# Patient Record
Sex: Male | Born: 1960 | Race: Black or African American | Hispanic: Yes | Smoking: Current every day smoker
Health system: Southern US, Community
[De-identification: ages and names within clinical notes are randomized; demographics above are authoritative.]

## PROBLEM LIST (undated history)

## (undated) DIAGNOSIS — I10 Essential (primary) hypertension: Secondary | ICD-10-CM

## (undated) DIAGNOSIS — K922 Gastrointestinal hemorrhage, unspecified: Secondary | ICD-10-CM

## (undated) DIAGNOSIS — Z8711 Personal history of peptic ulcer disease: Secondary | ICD-10-CM

## (undated) DIAGNOSIS — K920 Hematemesis: Secondary | ICD-10-CM

## (undated) DIAGNOSIS — F1010 Alcohol abuse, uncomplicated: Secondary | ICD-10-CM

## (undated) DIAGNOSIS — E119 Type 2 diabetes mellitus without complications: Secondary | ICD-10-CM

## (undated) DIAGNOSIS — F1410 Cocaine abuse, uncomplicated: Secondary | ICD-10-CM

## (undated) HISTORY — PX: ABDOMINAL SURGERY: SHX537

## (undated) MED ORDER — HYDROCODONE-ACETAMINOPHEN 5 MG-325 MG TAB
5-325 mg | ORAL_TABLET | ORAL | Status: DC | PRN
Start: ? — End: 2017-10-26

## (undated) MED ORDER — CLINDAMYCIN 300 MG CAP
300 mg | ORAL_CAPSULE | Freq: Four times a day (QID) | ORAL | Status: AC
Start: ? — End: 2012-08-07

---

## 2012-07-28 LAB — METABOLIC PANEL, BASIC
Anion gap: 10 mmol/L (ref 3.0–18)
BUN/Creatinine ratio: 10 — ABNORMAL LOW (ref 12–20)
BUN: 16 MG/DL (ref 7.0–18)
CO2: 26 MMOL/L (ref 21–32)
Calcium: 9.1 MG/DL (ref 8.5–10.1)
Chloride: 105 MMOL/L (ref 100–108)
Creatinine: 1.63 MG/DL — ABNORMAL HIGH (ref 0.6–1.3)
GFR est AA: 58 mL/min/{1.73_m2} — ABNORMAL LOW (ref >60)
GFR est non-AA: 48 mL/min/{1.73_m2} — ABNORMAL LOW (ref >60)
Glucose: 111 MG/DL — ABNORMAL HIGH (ref 74–99)
Potassium: 4.8 MMOL/L (ref 3.5–5.5)
Sodium: 141 MMOL/L (ref 136–145)

## 2012-07-28 LAB — CBC WITH AUTOMATED DIFF
ABS. BASOPHILS: 0 10*3/uL (ref 0.0–0.06)
ABS. EOSINOPHILS: 0.3 10*3/uL (ref 0.0–0.4)
ABS. LYMPHOCYTES: 1.4 10*3/uL (ref 0.9–3.6)
ABS. MONOCYTES: 1.1 10*3/uL (ref 0.05–1.2)
ABS. NEUTROPHILS: 7.1 10*3/uL (ref 1.8–8.0)
BASOPHILS: 0 % (ref 0–2)
EOSINOPHILS: 3 % (ref 0–5)
HCT: 37.5 % (ref 36.0–48.0)
HGB: 12.1 g/dL — ABNORMAL LOW (ref 13.0–16.0)
LYMPHOCYTES: 14 % — ABNORMAL LOW (ref 21–52)
MCH: 25.1 PG (ref 24.0–34.0)
MCHC: 32.3 g/dL (ref 31.0–37.0)
MCV: 77.6 FL (ref 74.0–97.0)
MONOCYTES: 11 % — ABNORMAL HIGH (ref 3–10)
MPV: 11.2 FL (ref 9.2–11.8)
NEUTROPHILS: 72 % (ref 40–73)
PLATELET: 197 10*3/uL (ref 135–420)
RBC: 4.83 M/uL (ref 4.70–5.50)
RDW: 14.9 % — ABNORMAL HIGH (ref 11.6–14.5)
WBC: 9.9 10*3/uL (ref 4.6–13.2)

## 2012-07-28 MED ADMIN — ketorolac (TORADOL) injection 30 mg: INTRAVENOUS | @ 20:00:00 | NDC 00409379501

## 2012-07-28 MED ADMIN — trimethoprim-sulfamethoxazole (BACTRIM DS, SEPTRA DS) 160-800 mg per tablet: ORAL | @ 20:00:00 | NDC 68084023011

## 2012-07-28 MED ADMIN — ceFAZolin (ANCEF) 1 gram injection: INTRAVENOUS | @ 20:00:00 | NDC 00409080501

## 2012-07-28 MED FILL — KETOROLAC TROMETHAMINE 60 MG/2 ML IM: 60 mg/2 mL | INTRAMUSCULAR | Qty: 2

## 2012-07-28 MED FILL — SODIUM CHLORIDE 0.9 % IV PIGGY BACK: INTRAVENOUS | Qty: 100

## 2012-07-28 MED FILL — TRIMETHOPRIM-SULFAMETHOXAZOLE 160 MG-800 MG TAB: 160-800 mg | ORAL | Qty: 2

## 2012-07-28 MED FILL — CEFAZOLIN 1 GRAM SOLUTION FOR INJECTION: 1 gram | INTRAMUSCULAR | Qty: 1000

## 2012-07-30 NOTE — ED Provider Notes (Addendum)
SEEN BY P.A. FOR CELLULITIS OF THE LEFT KNEE.   CONNECT CARE SYSTEM DOWN AND THINGS ON PAPER

## 2012-07-31 MED ADMIN — HYDROmorphone (PF) (DILAUDID) injection 1 mg: INTRAMUSCULAR | @ 21:00:00 | NDC 00409128331

## 2012-07-31 MED ADMIN — clindamycin (CLEOCIN) capsule 300 mg: ORAL | @ 21:00:00 | NDC 68084024311

## 2012-07-31 MED FILL — CLINDAMYCIN 150 MG CAP: 150 mg | ORAL | Qty: 2

## 2012-07-31 MED FILL — HYDROMORPHONE (PF) 1 MG/ML IJ SOLN: 1 mg/mL | INTRAMUSCULAR | Qty: 1

## 2012-07-31 NOTE — ED Notes (Signed)
I have reviewed discharge instructions with the patient.  The patient verbalized understanding.  Patient armband removed and shredded

## 2012-07-31 NOTE — ED Provider Notes (Signed)
HPI Comments: 51 year old male returns for a recheck. He reports that the swelling and warmth to his left left knee is much better, but the pain is increasing. He denies fever, chills.     Patient is a 51 y.o. male presenting with wound check. The history is provided by the patient. No language interpreter was used.   Wound Check          Past Medical History   Diagnosis Date   ??? Hypertension         History reviewed. No pertinent past surgical history.      History reviewed. No pertinent family history.     History     Social History   ??? Marital Status: DIVORCED     Spouse Name: N/A     Number of Children: N/A   ??? Years of Education: N/A     Occupational History   ??? Not on file.     Social History Main Topics   ??? Smoking status: Not on file   ??? Smokeless tobacco: Not on file   ??? Alcohol Use: Not on file   ??? Drug Use: Not on file   ??? Sexually Active: Not on file     Other Topics Concern   ??? Not on file     Social History Narrative   ??? No narrative on file                  ALLERGIES: Review of patient's allergies indicates no known allergies.      Review of Systems   Constitutional: Negative.  Negative for fever and chills.   HENT: Negative.    Respiratory: Negative.    Cardiovascular: Negative.    Gastrointestinal: Negative.  Negative for nausea and vomiting.   Musculoskeletal:        See HPI   Neurological: Negative.    All other systems reviewed and are negative.        Filed Vitals:    07/31/12 1546   BP: 144/81   Pulse: 83   Temp: 97.6 ??F (36.4 ??C)   Resp: 17   Height: 5\' 7"  (1.702 m)   Weight: 80.287 kg (177 lb)   SpO2: 100%            Physical Exam   Nursing note and vitals reviewed.  Constitutional: He is oriented to person, place, and time. He appears well-developed and well-nourished. No distress.   HENT:   Head: Normocephalic and atraumatic.   Eyes: Pupils are equal, round, and reactive to light.   Neck: Normal range of motion. Neck supple.   Cardiovascular: Normal rate.    Pulmonary/Chest: Effort normal.    Musculoskeletal: He exhibits edema and tenderness.        Left knee: He exhibits swelling and erythema. He exhibits normal range of motion, no effusion, no ecchymosis and no deformity.   Full range of motion with pain   Neurological: He is alert and oriented to person, place, and time.   Skin: Skin is warm and dry. He is not diaphoretic.        Psychiatric: He has a normal mood and affect. His behavior is normal.        MDM     Differential Diagnosis; Clinical Impression; Plan:     Abscess to left knee, will drain the area. Dr Denny Levy evaluated patient as he saw the knee two days ago. Agrees with plan, will change Keflex to Clindamycin.  I&D Abcess Simple  Consent: Verbal consent obtained.  Consent given by: patient  Date/Time: 07/31/2012 5:28 PM  Performed by: NPSupervising provider: Dr Denny Levy  Preparation: skin prepped with Betadine  Pre-procedure re-eval: Immediately prior to the procedure, the patient was reevaluated and found suitable for the planned procedure and any planned medications.  Time out: Immediately prior to the procedure a time out was called to verify the correct patient, procedure, equipment, staff and marking as appropriate..  Location: left knee.  Anesthesia: local infiltration  Local anesthetic: lidocaine 1% without epinephrine  Anesthetic total: 4 ml  Scalpel size: 11  Incision type: single with marsupialization  Complexity: simple  Drainage: purulent and bloody  Drainage amount: moderate  Wound treatment: wound left open  Post-procedure: dressing applied  Patient tolerance: Patient tolerated the procedure well with no immediate complications.  My total time at bedside, performing this procedure was 1-15 minutes.        Diagnosis:   1. Abscess    2. Cellulitis          Disposition: Home    Follow-up Information    Follow up With Details Comments Contact Info    [facility] EMERGENCY DEPT  As needed if symptoms worsen [phone]    Hyman Bible In 3 days  [address]  [place]  [address]  [phone]            Current Discharge Medication List      START taking these medications    Details   clindamycin (CLEOCIN) 300 mg capsule Take 1 Cap by mouth four (4) times daily for 7 days.  Qty: 28 Cap, Refills: 0      HYDROcodone-acetaminophen (NORCO) 5-325 mg per tablet Take 1 Tab by mouth every four (4) hours as needed for Pain.  Qty: 20 Tab, Refills: 0         CONTINUE these medications which have NOT CHANGED    Details   trimethoprim-sulfamethoxazole (BACTRIM) 80-400 mg per tablet Take 1 Tab by mouth two (2) times a day.      cephALEXin (KEFLEX) 250 mg capsule Take 500 mg by mouth four (4) times daily.      oxyCODONE-acetaminophen (PERCOCET) 10-325 mg per tablet Take 1 Tab by mouth.      hydrochlorothiazide (HYDRODIURIL) 25 mg tablet Take 25 mg by mouth daily.

## 2012-07-31 NOTE — ED Notes (Signed)
Seen 2 days ago for left knee infection. Here for recheck

## 2012-07-31 NOTE — ED Provider Notes (Signed)
I was personally available for consultation in the emergency department.  I have not seen the patient but have reviewed the chart prior to discharge and agree with the documentation recorded by the [facility], including the assessment, treatment plan, and disposition.  Amalia Greenhouse, MD  Pt seen with Cyndia Bent

## 2012-08-03 LAB — CULTURE, WOUND W GRAM STAIN

## 2014-05-02 ENCOUNTER — Emergency Department: Payer: Self-pay | Admitting: Emergency Medicine

## 2014-12-16 ENCOUNTER — Ambulatory Visit
Admit: 2014-12-16 | Discharge: 2014-12-16 | Payer: PRIVATE HEALTH INSURANCE | Attending: Surgery | Primary: Family Medicine

## 2014-12-16 DIAGNOSIS — R1031 Right lower quadrant pain: Secondary | ICD-10-CM

## 2014-12-16 NOTE — Progress Notes (Signed)
Hernia Evaluation      Subjective:     Justin Pace is a 54 y.o. male with a history of 4 right inguinal hernia repairs in the [place] from 2010 to 2015. 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 He is complaining about renewed right inguinal pain and numbness. He denies a bulge but says the pain is unbearable and is now associated with weakness in his anterior thigh. He says this has caused him to fall and recently break his right wrist. He is referred here by the [facility] for hernia surgery or ablation of a nerve as needed.     Patient Active Problem List    Diagnosis Date Noted   ??? Abscess 07/31/2012   ??? Cellulitis 07/31/2012     Past Medical History   Diagnosis Date   ??? Hypertension    ??? Neuropathic pain    ??? Diabetes (HCC)    ??? Depression    ??? Anxiety    ??? Gastric ulcer       Past Surgical History   Procedure Laterality Date   ??? Hx hernia repair Right 2010-2015     inguinal X5    ??? Nerve block        History   Substance Use Topics   ??? Smoking status: Current Some Day Smoker   ??? Smokeless tobacco: Not on file   ??? Alcohol Use: No      Family History   Problem Relation Age of Onset   ??? Alzheimer Mother    ??? Dementia Mother    ??? Diabetes Father    ??? Diabetes Brother       Current Outpatient Prescriptions   Medication Sig   ??? zolpidem (AMBIEN) 5 mg tablet Take  by mouth nightly as needed for Sleep.   ??? gabapentin (NEURONTIN) 100 mg capsule Take  by mouth three (3) times daily.   ??? metFORMIN (GLUCOPHAGE) 500 mg tablet Take  by mouth two (2) times daily (with meals).   ??? ASPIRIN/SALICYLAMIDE/CAFFEINE (BC HEADACHE POWDER PO) Take  by mouth.   ??? cephALEXin (KEFLEX) 250 mg capsule Take 500 mg by mouth four (4) times daily.   ??? oxyCODONE-acetaminophen (PERCOCET) 10-325 mg per tablet Take 1 Tab by mouth.   ??? HYDROcodone-acetaminophen (NORCO) 5-325 mg per tablet Take 1 Tab by mouth every four (4) hours as needed for Pain.     No current facility-administered medications for this visit.      Allergies    Allergen Reactions   ??? Motrin [Ibuprofen] Swelling        Review of Systems:  Positive in BOLD    CONST: Fever, weight loss, fatigue or chills  GI: Nausea, vomiting, abdominal pain, change in bowel habits, hematochezia, melena, and GERD   INTEG: Dermatitis, abnormal moles  HEENT: Recent changes in vision, vertigo, epistaxis, dysphagia and hoarseness  CV: Chest pain, palpitations, HTN, edema and varicosities  RESP: Cough, shortness of breath, wheezing, hemoptysis, snoring and reactive airway disease  GU: Hematuria, dysuria, frequency, urgency, nocturia and stress urinary incontinence   MS: Weakness, joint pain and arthritis  ENDO: Diabetes, thyroid disease, polyuria, polydipsia, polyphagia, poor wound healing, heat intolerance, cold intolerance  LYMPH/HEME: Anemia, bruising and history of blood transfusions  NEURO: Dizziness, headache, fainting, seizures and stroke, neuropathy  PSYCH: Anxiety and depression    Objective:     BP 133/83 mmHg   Pulse 80   Temp(Src) 98.6 ??F (37 ??C) (Oral)   Resp 20  Ht 5\' 7"  (1.702 m)   Wt 85.73 kg (189 lb)   BMI 29.59 kg/m2    Physical Exam:      GENERAL: alert, cooperative, mild distress, appears stated age  ZOX:[id number]:conjunctivae and sclerae normal, pupils equal, round, reactive to light, extraocular movements intact without nystagmus  THROAT & NECK: no erythema or exudates noted and neck supple and symmetrical; no palpable masses  LUNG: clear to auscultation bilaterally  HEART: Regular rate and rhythm  ABDOMEN: 2 topical pain patches overlying the right inguinal region. No evidence of hernia at the right groin. Complains of numbness about 5 cm superior to the inguinal incision and pain in this region as well. He states he is also having pain and weakness of the anterior thigh. I find no femoral hernia either.   EXTREMITIES:  Tender of the anterior thigh to light palpation  SKIN: Normal.      Assessment:   1. No evidence of recurrent hernia   2. His pain is best described as neuropathic, likely realted to prior nerve ablation attempts.         Plan:     1. [facility] for [facility] to pain management. I do not perform ablations  2.  No role for hernia repair as there is no evidence of hernia at this time.     Signed By: Maudry MayhewGREGORY F Tristy Udovich, MD     December 16, 2014

## 2014-12-16 NOTE — Progress Notes (Signed)
Justin Pace is a 54 y.o. male who presents today with   Chief Complaint   Patient presents with   ??? Inguinal Hernia     Pt presents today c/o of Right inguinal pain and weakness in right leg. Pt has had 5 right inguinal hernia repairs in each year from 2010-2015. Pt is here for surgical intervention.                  1. Have you been to the ER, urgent care clinic since your last visit?  Hospitalized since your last visit?No    2. Have you seen or consulted any other health care providers outside of the [facility] since your last visit?  Include any pap smears or colon screening. No

## 2015-06-12 ENCOUNTER — Telehealth: Payer: Self-pay | Admitting: Family Medicine

## 2017-07-06 ENCOUNTER — Emergency Department: Admit: 2017-07-06 | Primary: Family Medicine

## 2017-07-06 ENCOUNTER — Inpatient Hospital Stay
Admit: 2017-07-06 | Discharge: 2017-07-06 | Disposition: A | Discharge status: Home / Self Care | Attending: Emergency Medicine

## 2017-07-06 DIAGNOSIS — S161XXA Strain of muscle, fascia and tendon at neck level, initial encounter: Secondary | ICD-10-CM

## 2017-07-06 MED ORDER — CYCLOBENZAPRINE 10 MG TAB
10 mg | ORAL | Status: AC
Start: 2017-07-06 — End: 2017-07-06
  Administered 2017-07-06: 19:00:00 via ORAL

## 2017-07-06 MED ORDER — ACETAMINOPHEN 325 MG TABLET
325 mg | ORAL_TABLET | ORAL | 0 refills | Status: DC | PRN
Start: 2017-07-06 — End: 2017-10-26

## 2017-07-06 MED ORDER — HYDROCODONE-ACETAMINOPHEN 5 MG-325 MG TAB
5-325 mg | ORAL | Status: AC
Start: 2017-07-06 — End: 2017-07-06
  Administered 2017-07-06: 19:00:00 via ORAL

## 2017-07-06 MED ORDER — CYCLOBENZAPRINE 10 MG TAB
10 mg | ORAL_TABLET | Freq: Three times a day (TID) | ORAL | 0 refills | Status: DC | PRN
Start: 2017-07-06 — End: 2017-10-26

## 2017-07-06 MED FILL — HYDROCODONE-ACETAMINOPHEN 5 MG-325 MG TAB: 5-325 mg | ORAL | Qty: 1

## 2017-07-06 MED FILL — CYCLOBENZAPRINE 10 MG TAB: 10 mg | ORAL | Qty: 1
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
## 2017-07-06 NOTE — ED Notes (Signed)
Patient discharged to home with Rx and instructions given, patient voices understanding
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
## 2017-10-26 ENCOUNTER — Emergency Department: Admit: 2017-10-26 | Primary: Family Medicine

## 2017-10-26 ENCOUNTER — Inpatient Hospital Stay
Admit: 2017-10-26 | Discharge: 2017-10-27 | Disposition: A | Discharge status: Home / Self Care | Attending: Emergency Medicine

## 2017-10-26 DIAGNOSIS — M25531 Pain in right wrist: Secondary | ICD-10-CM

## 2017-10-26 MED ORDER — MORPHINE 4 MG/ML INTRAVENOUS SOLUTION
4 mg/mL | INTRAVENOUS | Status: AC
Start: 2017-10-26 — End: 2017-10-26
  Administered 2017-10-27: via INTRAVENOUS

## 2017-10-26 MED ORDER — PANTOPRAZOLE 40 MG IV SOLR
40 mg | INTRAVENOUS | Status: AC
Start: 2017-10-26 — End: 2017-10-26
  Administered 2017-10-27: via INTRAVENOUS

## 2017-10-26 MED FILL — PROTONIX 40 MG INTRAVENOUS SOLUTION: 40 mg | INTRAVENOUS | Qty: 40

## 2017-10-26 NOTE — ED Provider Notes (Addendum)
EMERGENCY DEPARTMENT HISTORY AND PHYSICAL EXAM    6:39 PM      Date: 10/26/2017  Patient Name: Justin Pace    History of Presenting Illness     Chief Complaint   Patient presents with   ??? Epigastric Pain   ??? Wrist Pain         History Provided By: Patient    Chief Complaint: epigastric pain, right wrist pain  Duration:  Days  Timing:  Acute, Gradual and Worsening  Location: epigastric, left wrist   Quality: Aching  Severity: Moderate  Modifying Factors: n/a  Associated Symptoms: denies any other associated signs or symptoms      Additional History (Context): Justin Pace is a 57 y.o. male with No significant past medical history who presents with epigastric pain x 3 days similar to previous when patient was diagnosed with stomach ulcer. Denies NVD. States pain is dull, decreases appetite as eating seems to worsen pain. Denies fever, back pain, cp, sob, urinary sx. States he saw GI last Monday and has apt for endoscopy in Feb.    Pt also reports right wrist, slipped while trying to get out of bed the other day. Pain with ROM since. H/o surgery to right wrist so wants to make sure it is not broken.     PCP: Christen Butter, MD    Current Facility-Administered Medications   Medication Dose Route Frequency Provider Last Rate Last Dose   ??? pantoprazole (PROTONIX) injection 40 mg  40 mg IntraVENous NOW Dartha Lodge E, PA-C       ??? morphine injection 4 mg  4 mg IntraVENous NOW Hurman Horn, PA-C         Current Outpatient Medications   Medication Sig Dispense Refill   ??? omeprazole (PRILOSEC) 40 mg capsule Take 1 Cap by mouth daily for 30 days. 30 Cap 0   ??? raNITIdine (ZANTAC) 150 mg tablet Take 1 Tab by mouth two (2) times a day for 14 days. 28 Tab 0   ??? hydroCHLOROthiazide (HYDRODIURIL) 25 mg tablet Take 25 mg by mouth daily.     ??? metFORMIN (GLUCOPHAGE) 500 mg tablet Take  by mouth two (2) times daily (with meals).         Past History     Past Medical History:  Past Medical History:   Diagnosis Date    ??? Anxiety    ??? Depression    ??? Diabetes ([place])    ??? Gastric ulcer    ??? Hypertension    ??? Neuropathic pain        Past Surgical History:  Past Surgical History:   Procedure Laterality Date   ??? HX HERNIA REPAIR Right 2010-2015    inguinal X5    ??? NERVE BLOCK         Family History:  Family History   Problem Relation Age of Onset   ??? Alzheimer Mother    ??? Dementia Mother    ??? Diabetes Father    ??? Diabetes Brother        Social History:  Social History     Tobacco Use   ??? Smoking status: Current Some Day Smoker   ??? Smokeless tobacco: Never Used   Substance Use Topics   ??? Alcohol use: No     Alcohol/week: 0.0 oz   ??? Drug use: No       Allergies:  Allergies   Allergen Reactions   ??? Motrin [Ibuprofen] Swelling   ??? Norco [  Hydrocodone-Acetaminophen] Angioedema         Review of Systems       Review of Systems   Constitutional: Negative for chills and fever.   HENT: Negative for congestion.    Respiratory: Negative for cough and wheezing.    Cardiovascular: Negative for chest pain and leg swelling.   Gastrointestinal: Positive for abdominal pain. Negative for constipation, diarrhea, nausea and vomiting.   Genitourinary: Negative.    Musculoskeletal: Positive for arthralgias and joint swelling.   Skin: Negative.    Neurological: Negative for dizziness, seizures, weakness and headaches.   Hematological: Negative.    Psychiatric/Behavioral: Negative.    All other systems reviewed and are negative.        Physical Exam     Visit Vitals  BP 133/89 (BP 1 Location: Left arm, BP Patient Position: At rest)   Pulse 99   Temp 98.5 ??F (36.9 ??C)   Resp 18   Ht '5\' 7"'$  (1.702 m)   Wt 83.9 kg (185 lb)   SpO2 97%   BMI 28.98 kg/m??         Physical Exam   Constitutional: He is oriented to person, place, and time. He appears well-developed and well-nourished. No distress.   NAD, well hydrated, non toxic     HENT:   Head: Normocephalic and atraumatic.   Nose: Nose normal.   Mouth/Throat: Oropharynx is clear and moist. No oropharyngeal exudate.    Eyes: Conjunctivae and EOM are normal. Pupils are equal, round, and reactive to light.   Neck: Normal range of motion. Neck supple.   Cardiovascular: Normal rate, regular rhythm and normal heart sounds.   No murmur heard.  Pulmonary/Chest: Effort normal and breath sounds normal. No respiratory distress. He has no wheezes. He has no rales.   Abdominal: Soft. He exhibits no distension. There is no hepatosplenomegaly, splenomegaly or hepatomegaly. There is tenderness in the epigastric area. There is no rigidity, no rebound, no guarding, no CVA tenderness, no tenderness at McBurney's point and negative Murphy's sign. No hernia.   Musculoskeletal: Normal range of motion.   Lymphadenopathy:     He has no cervical adenopathy.   Neurological: He is alert and oriented to person, place, and time. No cranial nerve deficit. Coordination normal.   Skin: Skin is warm. No rash noted. He is not diaphoretic.   Psychiatric: He has a normal mood and affect. His behavior is normal.   Nursing note and vitals reviewed.        Diagnostic Study Results     Labs -  Recent Results (from the past 12 hour(s))   CBC WITH AUTOMATED DIFF    Collection Time: 10/26/17  6:45 PM   Result Value Ref Range    WBC 7.0 4.6 - 13.2 K/uL    RBC 5.24 4.70 - 5.50 M/uL    HGB 13.1 13.0 - 16.0 g/dL    HCT 40.5 36.0 - 48.0 %    MCV 77.3 74.0 - 97.0 FL    MCH 25.0 24.0 - 34.0 PG    MCHC 32.3 31.0 - 37.0 g/dL    RDW 18.7 (H) 11.6 - 14.5 %    PLATELET 194 135 - 420 K/uL    MPV 10.6 9.2 - 11.8 FL    NEUTROPHILS 84 (H) 40 - 73 %    LYMPHOCYTES 7 (L) 21 - 52 %    MONOCYTES 8 3 - 10 %    EOSINOPHILS 1 0 - 5 %  BASOPHILS 0 0 - 2 %    ABS. NEUTROPHILS 5.8 1.8 - 8.0 K/UL    ABS. LYMPHOCYTES 0.5 (L) 0.9 - 3.6 K/UL    ABS. MONOCYTES 0.6 0.05 - 1.2 K/UL    ABS. EOSINOPHILS 0.1 0.0 - 0.4 K/UL    ABS. BASOPHILS 0.0 0.0 - 0.1 K/UL    DF AUTOMATED     METABOLIC PANEL, COMPREHENSIVE    Collection Time: 10/26/17  6:45 PM   Result Value Ref Range    Sodium 139 136 - 145 mmol/L     Potassium 3.9 3.5 - 5.5 mmol/L    Chloride 104 100 - 108 mmol/L    CO2 21 21 - 32 mmol/L    Anion gap 14 3.0 - 18 mmol/L    Glucose 135 (H) 74 - 99 mg/dL    BUN 12 7.0 - 18 MG/DL    Creatinine 1.18 0.6 - 1.3 MG/DL    BUN/Creatinine ratio 10 (L) 12 - 20      GFR est AA >60 >60 ml/min/1.3m    GFR est non-AA >60 >60 ml/min/1.792m   Calcium 8.8 8.5 - 10.1 MG/DL    Bilirubin, total 0.4 0.2 - 1.0 MG/DL    ALT (SGPT) 21 16 - 61 U/L    AST (SGOT) 18 15 - 37 U/L    Alk. phosphatase 113 45 - 117 U/L    Protein, total 7.2 6.4 - 8.2 g/dL    Albumin 3.8 3.4 - 5.0 g/dL    Globulin 3.4 2.0 - 4.0 g/dL    A-G Ratio 1.1 0.8 - 1.7     LIPASE    Collection Time: 10/26/17  6:45 PM   Result Value Ref Range    Lipase 125 73 - 393 U/L       Radiologic Studies -   XR WRIST RT AP/LAT/OBL MIN 3V    (Results Pending)   CT ABD PELV W CONT    (Results Pending)         Medical Decision Making   I am the first provider for this patient.    I reviewed the vital signs, available nursing notes, past medical history, past surgical history, family history and social history.    Vital Signs-Reviewed the patient's vital signs.      Records Reviewed: Nursing Notes, Old Medical Records, Previous Radiology Studies and Previous Laboratory Studies (Time of Review: 6:39 PM)    ED Course: Progress Notes, Reevaluation, and Consults:  7:28 PM  Pain improved with medication. Labs reviewed and WNL.   Xray of wrist WNL- I do not appreciate new fracture, will place in ace wrap for comfort and refer to ortho.  CT of abdomen/pelvis- negative for acute process, no new pathology.   S/s most likely c/w gastritis, possible recurrent ulcer. Has GI for f/u and already has endoscopy scheduled for next week.   No indication for further work up/need for further imaging/labs. Will d/chome with strict return sx and f/u    Provider Notes (Medical Decision Making): MDM  Number of Diagnoses or Management Options  Abdominal pain, epigastric:   Wrist pain, acute, right:    Diagnosis management comments: 57yo here with epigastric pain x3 days. Also here with right wrist pain. H/o stomach ulcers feels the same, saw GI Monday and she made apt for endoscopy in Feb. Labs, IV meds, xray.            Amount and/or Complexity of Data Reviewed  Clinical lab tests: ordered  and reviewed  Tests in the radiology section of CPT??: reviewed and ordered           Diagnosis     Clinical Impression: epigastric pain, right wrist pain  Disposition: d/c    Follow-up Information     Follow up With Specialties Details Why Contact Info    Christen Butter, MD [facility]   [place] [address]  [phone]      [facility] EMERGENCY DE[facility] Emergency Medicine   [place] [address]  [phone]    [facility]   [address] [place]  [phone]              Medication List      START taking these medications    omeprazole 40 mg capsule  Commonly known as:  PRILOSEC  Take 1 Cap by mouth daily for 30 days.     raNITIdine 150 mg tablet  Commonly known as:  ZANTAC  Take 1 Tab by mouth two (2) times a day for 14 days.        ASK your doctor about these medications    hydroCHLOROthiazide 25 mg tablet  Commonly known as:  HYDRODIURIL     metFORMIN 500 mg tablet  Commonly known as:  GLUCOPHAGE           Where to Get Your Medications      Information about where to get these medications is not yet available    Ask your nurse or doctor about these medications  ?? omeprazole 40 mg capsule  ?? raNITIdine 150 mg tablet       _______________________________

## 2017-10-26 NOTE — ED Notes (Signed)
I have reviewed discharge instructions with the patient.  The patient verbalized understanding.  Patient discharged without removing armband.  Pt declined removal of armband; pt was warned of dangers associated with loss of patient information.  He verbalized understanding.

## 2017-10-26 NOTE — ED Notes (Signed)
7:25 PM :Pt care assumed from Danielle RankinJacson, Sara E, [place], ED provider. Pt complaint(s), current treatment plan, progression and available diagnostic results have been discussed thoroughly.  Rounding occurred: yes  Intended Disposition: pending CT  Pending diagnostic reports and/or labs (please list): CT    8:51 PM  Pt states they are feeling better. Plan for discharge home with GI FU in 2 days.     Scribe Attestation     Ameren CorporationKayleigh Hopping acting as a Neurosurgeonscribe for and in the presence of Sherren KernsBrown, Paris Hohn B, MD      October 26, 2017 at 8:26 PM       Provider Attestation:      I personally performed the services described in the documentation, reviewed the documentation, as recorded by the scribe in my presence, and it accurately and completely records my words and actions. October 26, 2017 at 8:26 PM - Sherren KernsBrown, Whyatt Klinger B, MD

## 2017-10-26 NOTE — ED Triage Notes (Signed)
Patient states epigastric pain x 3 days.  States last bowel movement today and considered normal.  Patient states injury to right wrist.

## 2017-10-26 NOTE — ED Notes (Signed)
Pt was given ice water to drink; okay per Dr Manson PasseyBrown.  Awaiting results from Dr Manson PasseyBrown.  Pt reports improvement of epigastric pain 5/10 @ present.  Will continue to monitor.

## 2017-10-27 LAB — CBC WITH AUTOMATED DIFF
ABS. BASOPHILS: 0 10*3/uL (ref 0.0–0.1)
ABS. EOSINOPHILS: 0.1 10*3/uL (ref 0.0–0.4)
ABS. LYMPHOCYTES: 0.5 10*3/uL — ABNORMAL LOW (ref 0.9–3.6)
ABS. MONOCYTES: 0.6 10*3/uL (ref 0.05–1.2)
ABS. NEUTROPHILS: 5.8 10*3/uL (ref 1.8–8.0)
BASOPHILS: 0 % (ref 0–2)
EOSINOPHILS: 1 % (ref 0–5)
HCT: 40.5 % (ref 36.0–48.0)
HGB: 13.1 g/dL (ref 13.0–16.0)
LYMPHOCYTES: 7 % — ABNORMAL LOW (ref 21–52)
MCH: 25 PG (ref 24.0–34.0)
MCHC: 32.3 g/dL (ref 31.0–37.0)
MCV: 77.3 FL (ref 74.0–97.0)
MONOCYTES: 8 % (ref 3–10)
MPV: 10.6 FL (ref 9.2–11.8)
NEUTROPHILS: 84 % — ABNORMAL HIGH (ref 40–73)
PLATELET: 194 10*3/uL (ref 135–420)
RBC: 5.24 M/uL (ref 4.70–5.50)
RDW: 18.7 % — ABNORMAL HIGH (ref 11.6–14.5)
WBC: 7 10*3/uL (ref 4.6–13.2)

## 2017-10-27 LAB — METABOLIC PANEL, COMPREHENSIVE
A-G Ratio: 1.1 (ref 0.8–1.7)
ALT (SGPT): 21 U/L (ref 16–61)
AST (SGOT): 18 U/L (ref 15–37)
Albumin: 3.8 g/dL (ref 3.4–5.0)
Alk. phosphatase: 113 U/L (ref 45–117)
Anion gap: 14 mmol/L (ref 3.0–18)
BUN/Creatinine ratio: 10 — ABNORMAL LOW (ref 12–20)
BUN: 12 MG/DL (ref 7.0–18)
Bilirubin, total: 0.4 MG/DL (ref 0.2–1.0)
CO2: 21 mmol/L (ref 21–32)
Calcium: 8.8 MG/DL (ref 8.5–10.1)
Chloride: 104 mmol/L (ref 100–108)
Creatinine: 1.18 MG/DL (ref 0.6–1.3)
GFR est AA: >60 mL/min/{1.73_m2} (ref >60)
GFR est non-AA: >60 mL/min/{1.73_m2} (ref >60)
Globulin: 3.4 g/dL (ref 2.0–4.0)
Glucose: 135 mg/dL — ABNORMAL HIGH (ref 74–99)
Potassium: 3.9 mmol/L (ref 3.5–5.5)
Protein, total: 7.2 g/dL (ref 6.4–8.2)
Sodium: 139 mmol/L (ref 136–145)

## 2017-10-27 LAB — TROPONIN I: Troponin-I, QT: <0.02 NG/ML (ref 0.00–0.06)

## 2017-10-27 LAB — LIPASE: Lipase: 125 U/L (ref 73–393)

## 2017-10-27 MED ORDER — OMEPRAZOLE 40 MG CAP, DELAYED RELEASE
40 mg | ORAL_CAPSULE | Freq: Every day | ORAL | 0 refills | Status: AC
Start: 2017-10-27 — End: 2017-11-25

## 2017-10-27 MED ORDER — ACETAMINOPHEN-CODEINE 300 MG-30 MG TAB
300-30 mg | ORAL_TABLET | Freq: Four times a day (QID) | ORAL | 0 refills | Status: AC | PRN
Start: 2017-10-27 — End: ?

## 2017-10-27 MED ORDER — LIDOCAINE 5 % (700 MG/PATCH) ADHESIVE PATCH
5 % | CUTANEOUS | 0 refills | Status: AC
Start: 2017-10-27 — End: ?

## 2017-10-27 MED ORDER — IOPAMIDOL 61 % IV SOLN
300 mg iodine /mL (61 %) | Freq: Once | INTRAVENOUS | Status: AC
Start: 2017-10-27 — End: 2017-10-26
  Administered 2017-10-27: via INTRAVENOUS

## 2017-10-27 MED ORDER — DICYCLOMINE 20 MG TAB
20 mg | ORAL_TABLET | Freq: Four times a day (QID) | ORAL | 0 refills | Status: AC
Start: 2017-10-27 — End: 2017-10-31

## 2017-10-27 MED ORDER — RANITIDINE 150 MG TAB
150 mg | ORAL_TABLET | Freq: Two times a day (BID) | ORAL | 0 refills | Status: AC
Start: 2017-10-27 — End: 2017-11-09

## 2017-10-27 MED ORDER — OMEPRAZOLE 40 MG CAP, DELAYED RELEASE
40 mg | ORAL_CAPSULE | Freq: Every day | ORAL | 0 refills | Status: DC
Start: 2017-10-27 — End: 2017-10-26

## 2017-10-27 MED FILL — MORPHINE 4 MG/ML INTRAVENOUS SOLUTION: 4 mg/mL | INTRAVENOUS | Qty: 1

## 2017-10-27 MED FILL — ISOVUE-300  61 % INTRAVENOUS SOLUTION: 300 mg iodine /mL (61 %) | INTRAVENOUS | Qty: 100

## 2018-03-02 ENCOUNTER — Ambulatory Visit: Payer: Self-pay | Admitting: Family Medicine

## 2018-05-06 ENCOUNTER — Ambulatory Visit: Payer: Self-pay | Admitting: Family Medicine

## 2018-08-21 ENCOUNTER — Other Ambulatory Visit: Payer: Self-pay

## 2018-08-21 ENCOUNTER — Encounter: Payer: Self-pay | Admitting: *Deleted

## 2018-08-21 ENCOUNTER — Emergency Department
Admission: EM | Admit: 2018-08-21 | Discharge: 2018-08-22 | Discharge status: Against Medical Advice | Payer: Self-pay | Attending: Emergency Medicine | Admitting: Emergency Medicine

## 2018-08-21 DIAGNOSIS — R7989 Other specified abnormal findings of blood chemistry: Secondary | ICD-10-CM

## 2018-08-21 DIAGNOSIS — F17210 Nicotine dependence, cigarettes, uncomplicated: Secondary | ICD-10-CM | POA: Insufficient documentation

## 2018-08-21 DIAGNOSIS — F1410 Cocaine abuse, uncomplicated: Secondary | ICD-10-CM

## 2018-08-21 DIAGNOSIS — I10 Essential (primary) hypertension: Secondary | ICD-10-CM

## 2018-08-21 DIAGNOSIS — F1010 Alcohol abuse, uncomplicated: Secondary | ICD-10-CM

## 2018-08-21 DIAGNOSIS — K264 Chronic or unspecified duodenal ulcer with hemorrhage: Secondary | ICD-10-CM

## 2018-08-21 DIAGNOSIS — Z7984 Long term (current) use of oral hypoglycemic drugs: Secondary | ICD-10-CM | POA: Insufficient documentation

## 2018-08-21 DIAGNOSIS — E876 Hypokalemia: Secondary | ICD-10-CM

## 2018-08-21 DIAGNOSIS — E119 Type 2 diabetes mellitus without complications: Secondary | ICD-10-CM | POA: Insufficient documentation

## 2018-08-21 DIAGNOSIS — Z79899 Other long term (current) drug therapy: Secondary | ICD-10-CM | POA: Insufficient documentation

## 2018-08-21 DIAGNOSIS — Z5329 Procedure and treatment not carried out because of patient's decision for other reasons: Secondary | ICD-10-CM | POA: Insufficient documentation

## 2018-08-21 HISTORY — DX: Cocaine abuse, uncomplicated: F14.10

## 2018-08-21 HISTORY — DX: Essential (primary) hypertension: I10

## 2018-08-21 HISTORY — DX: Type 2 diabetes mellitus without complications: E11.9

## 2018-08-21 HISTORY — DX: Hematemesis: K92.0

## 2018-08-21 HISTORY — DX: Gastrointestinal hemorrhage, unspecified: K92.2

## 2018-08-21 HISTORY — DX: Personal history of peptic ulcer disease: Z87.11

## 2018-08-21 HISTORY — DX: Alcohol abuse, uncomplicated: F10.10

## 2018-08-21 LAB — CBC
HEMATOCRIT: 45.1 % (ref 39.0–52.0)
HEMOGLOBIN: 14.8 g/dL (ref 13.0–17.0)
MCH: 28.7 pg (ref 26.0–34.0)
MCHC: 32.8 g/dL (ref 30.0–36.0)
MCV: 87.4 fL (ref 80.0–100.0)
Platelets: 265 10*3/uL (ref 150–400)
RBC: 5.16 MIL/uL (ref 4.22–5.81)
RDW: 14.4 % (ref 11.5–15.5)
WBC: 4.2 10*3/uL (ref 4.0–10.5)
nRBC: 0 % (ref 0.0–0.2)

## 2018-08-21 LAB — URINE DRUG SCREEN, QUALITATIVE (ARMC ONLY)
Amphetamines, Ur Screen: NOT DETECTED
Barbiturates, Ur Screen: NOT DETECTED
Benzodiazepine, Ur Scrn: NOT DETECTED
CANNABINOID 50 NG, UR ~~LOC~~: NOT DETECTED
COCAINE METABOLITE, UR ~~LOC~~: POSITIVE — AB
MDMA (ECSTASY) UR SCREEN: NOT DETECTED
Methadone Scn, Ur: NOT DETECTED
Opiate, Ur Screen: NOT DETECTED
Phencyclidine (PCP) Ur S: NOT DETECTED
TRICYCLIC, UR SCREEN: NOT DETECTED

## 2018-08-21 LAB — ETHANOL: Alcohol, Ethyl (B): 237 mg/dL — ABNORMAL HIGH (ref <10)

## 2018-08-21 MED ORDER — ONDANSETRON HCL 4 MG/2ML IJ SOLN
4.0000 mg | INTRAMUSCULAR | Status: AC
  Administered 2018-08-21: 4 mg via INTRAVENOUS
  Filled 2018-08-21: qty 2

## 2018-08-21 MED ORDER — MORPHINE SULFATE (PF) 4 MG/ML IV SOLN
4.0000 mg | Freq: Once | INTRAVENOUS | Status: AC
  Administered 2018-08-21: 4 mg via INTRAVENOUS
  Filled 2018-08-21: qty 1

## 2018-08-21 MED ORDER — SODIUM CHLORIDE 0.9 % IV BOLUS
1000.0000 mL | Freq: Once | INTRAVENOUS | Status: AC
  Administered 2018-08-22: 1000 mL via INTRAVENOUS

## 2018-08-21 NOTE — ED Triage Notes (Signed)
Pt presents w/ c/o LUQ abdominal pain. Pt states he has hx of bleeding ulcers and has been having GI bleeding and vomiting blood x 4 days. Pt states he has been drinking ETOH to help with the pain. Pt admits to 2 "bulls" tonight, 8 oz each. Pt is thrashing about on the stretcher and holding his LUQ. Pt suddenly stopped and when asked to rate his pain, had to be prompted x 2 to do so.

## 2018-08-21 NOTE — ED Provider Notes (Addendum)
[facility] Emergency Department Provider Note  ____________________________________________   First MD Initiated Contact with Patient 08/21/18 2302     (approximate)  I have reviewed the triage vital signs and the nursing notes.   HISTORY  Chief Complaint Abdominal Pain    HPI Francisco Coleman is a 57 y.o. male with medical history as listed below and who reports that he gets most of his care at the [facility].  He presents by EMS for evaluation of abdominal pain and the left upper quadrant that has been gradually worsening over the last 4 days.  He reports that he has been vomiting with blood in it and is also been having bright red blood per rectum as well as some dark stools.  He has a history of bleeding ulcers and says that he has been drinking more alcohol than usual to try to help with the pain.  He denies any illegal drug use.  He denies taking any NSAIDs recently and knows he is not supposed to do so.  He is unclear about how much she drinks a day but reports "at least 2".  He denies fever/chills, chest pain, and dysuria.  He reports his pain is severe and says that it comes in waves and feels sharp and stabbing as well as a cramping sensation.  He has not had any recent injury.  Nothing in particular makes it better or worse.   Past Medical History:  Diagnosis Date  . Alcohol abuse   . Cocaine abuse (HCC)   . Diabetes mellitus without complication (HCC)   . GI bleeding   . History of bleeding ulcers   . Hypertension   . Vomiting blood     There are no active problems to display for this patient.   Past Surgical History:  Procedure Laterality Date  . ABDOMINAL SURGERY      Prior to Admission medications   Medication Sig Start Date End Date Taking? Authorizing Provider  hydrochlorothiazide (HYDRODIURIL) 25 MG tablet Take 25 mg by mouth daily.   Yes [provider]  metFORMIN (GLUCOPHAGE) 500 MG tablet Take 500 mg by mouth 2 (two)  times daily with a meal.   Yes [provider]  ondansetron (ZOFRAN ODT) 4 MG disintegrating tablet Allow 1-2 tablets to dissolve in your mouth every 8 hours as needed for nausea/vomiting 08/22/18   Loleta Rose, MD  potassium chloride SA (KLOR-CON M20) 20 MEQ tablet Take 1 tablet (20 mEq total) by mouth daily. 08/22/18   Loleta Rose, MD    Allergies Ibuprofen  History reviewed. No pertinent family history.  Social History Social History   Tobacco Use  . Smoking status: Current Every Day Smoker    Types: Cigarettes  . Smokeless tobacco: Never Used  Substance Use Topics  . Alcohol use: Yes    Comment: daily  . Drug use: Yes    Types: Cocaine    Review of Systems Constitutional: No fever/chills Eyes: No visual changes. ENT: No sore throat. Cardiovascular: Denies chest pain. Respiratory: Denies shortness of breath. Gastrointestinal: Abdominal pain with nausea, vomiting, hematemesis, melena and hematochezia, all as described above. Genitourinary: Negative for dysuria. Musculoskeletal: Negative for neck pain.  Negative for back pain. Integumentary: Negative for rash. Neurological: Negative for headaches, focal weakness or numbness.   ____________________________________________   PHYSICAL EXAM:  VITAL SIGNS: ED Triage Vitals  Enc Vitals Group     BP 08/21/18 2257 (!) 173/114     Pulse Rate 08/21/18 2257  92     Resp 08/21/18 2257 (!) 23     Temp 08/21/18 2257 (!) 97.5 F (36.4 C)     Temp Source 08/21/18 2257 Oral     SpO2 08/21/18 2247 96 %     Weight 08/21/18 2258 81.6 kg (180 lb)     Height 08/21/18 2258 1.702 m (5\' 7" )     Head Circumference --      Peak Flow --      Pain Score 08/21/18 2257 10     Pain Loc --      Pain Edu? --      Excl. in GC? --     Constitutional: Alert and oriented.  Generally well-appearing but occasionally will double over in pain. Eyes: Conjunctivae are normal.  Head: Atraumatic. Nose: No  congestion/rhinnorhea. Mouth/Throat: Mucous membranes are moist. Neck: No stridor.  No meningeal signs.   Cardiovascular: Normal rate, regular rhythm. Good peripheral circulation. Grossly normal heart sounds. Respiratory: Normal respiratory effort.  No retractions. Lungs CTAB. Gastrointestinal: Soft and nondistended.  No lower abdominal tenderness to palpation.  Severe tenderness to the epigastrium and left upper quadrant, no significant tenderness in the right upper quadrant and negative Murphy sign. Rectal: Nontender, light brown stool that is weakly heme positive.  Quality control passed.  ED chaperone present throughout exam. Musculoskeletal: No lower extremity tenderness nor edema. No gross deformities of extremities. Neurologic: Slightly slurred speech. No gross focal neurologic deficits are appreciated.  Skin:  Skin is warm, dry and intact. No rash noted.   ____________________________________________   LABS (all labs ordered are listed, but only abnormal results are displayed)  Labs Reviewed  ETHANOL - Abnormal; Notable for the following components:      Result Value   Alcohol, Ethyl (B) 237 (*)    All other components within normal limits  URINALYSIS, ROUTINE W REFLEX MICROSCOPIC - Abnormal; Notable for the following components:   Color, Urine STRAW (*)    APPearance CLEAR (*)    Specific Gravity, Urine 1.003 (*)    Glucose, UA 50 (*)    Hgb urine dipstick SMALL (*)    All other components within normal limits  URINE DRUG SCREEN, QUALITATIVE ([facility] ONLY) - Abnormal; Notable for the following components:   Cocaine Metabolite,Ur [place] POSITIVE (*)    All other components within normal limits  LACTIC ACID, PLASMA - Abnormal; Notable for the following components:   Lactic Acid, Venous 2.7 (*)    All other components within normal limits  LACTIC ACID, PLASMA - Abnormal; Notable for the following components:   Lactic Acid, Venous 2.2 (*)    All other components within normal  limits  COMPREHENSIVE METABOLIC PANEL - Abnormal; Notable for the following components:   Potassium 2.7 (*)    CO2 34 (*)    Glucose, Bld 178 (*)    Calcium 8.7 (*)    AST 82 (*)    All other components within normal limits  CBC  LIPASE, BLOOD  MAGNESIUM  TROPONIN I  POC OCCULT BLOOD, ED   ____________________________________________  EKG  ED ECG REPORT I, Loleta Rose, the attending physician, personally viewed and interpreted this ECG.  Date: 08/21/2018 EKG Time: 22:59 Rate: 85 Rhythm: normal sinus rhythm QRS Axis: normal Intervals: normal ST/T Wave abnormalities: Non-specific ST segment / T-wave changes, but no evidence of acute ischemia. Narrative Interpretation: no evidence of acute ischemia   ____________________________________________  RADIOLOGY   ED MD interpretation:  Likely duodenal ulcers  Official radiology report(s):  Ct Abdomen Pelvis W Contrast  Result Date: 08/22/2018 CLINICAL DATA:  Acute onset of left upper quadrant abdominal pain. GI bleeding. Hematemesis. EXAM: CT ABDOMEN AND PELVIS WITH CONTRAST TECHNIQUE: Multidetector CT imaging of the abdomen and pelvis was performed using the standard protocol following bolus administration of intravenous contrast. CONTRAST:  ISOVUE-300 IOPAMIDOL (ISOVUE-300) INJECTION 61% COMPARISON:  None. FINDINGS: Lower chest: Minimal right basilar atelectasis is noted. Scattered calcifications are noted at the right lung base. The visualized portions of the mediastinum are unremarkable. Hepatobiliary: The liver is unremarkable in appearance. The gallbladder is unremarkable in appearance. The common bile duct remains normal in caliber. Pancreas: There is minimal soft tissue inflammation about the pancreas. This may reflect a duodenal process, as the inflammation appears centered about the duodenum. Spleen: The spleen is unremarkable in appearance. Adrenals/Urinary Tract: The adrenal glands are unremarkable in appearance.  Minimal left-sided renal pelvicaliectasis is noted. There is no evidence of hydronephrosis. No renal or ureteral stones are identified. Mild nonspecific perinephric stranding is noted bilaterally. Stomach/Bowel: Somewhat irregular wall thickening is noted along the first and second segments of the duodenum, with surrounding soft tissue inflammation. This could reflect acute duodenitis, though underlying ulceration or mass cannot be excluded. The remaining small bowel is unremarkable. The appendix is normal in caliber, without evidence of appendicitis. The colon is grossly unremarkable in appearance. Vascular/Lymphatic: Scattered calcification is seen along the abdominal aorta and its branches. The abdominal aorta is otherwise grossly unremarkable. The inferior vena cava is grossly unremarkable. No retroperitoneal lymphadenopathy is seen. No pelvic sidewall lymphadenopathy is identified. Reproductive: The bladder is mildly distended and grossly unremarkable. The prostate remains normal in size. Other: No additional soft tissue abnormalities are seen. Musculoskeletal: No acute osseous abnormalities are identified. The visualized musculature is unremarkable in appearance. IMPRESSION: 1. Somewhat irregular wall thickening along the first and second segments of the duodenum, with surrounding soft tissue inflammation. This could reflect acute duodenitis, though underlying ulceration or mass cannot be excluded. Endoscopy would be helpful for further evaluation, when and as deemed clinically appropriate. 2. Minimal soft tissue inflammation about the pancreas may reflect the duodenal process, as the inflammation appears centered about the duodenum. Aortic Atherosclerosis (ICD10-I70.0). Electronically Signed   By: Roanna Raider M.D.   On: 08/22/2018 01:19    ____________________________________________   PROCEDURES  Critical Care performed: No   Procedure(s) performed:    Procedures   ____________________________________________   INITIAL IMPRESSION / ASSESSMENT AND PLAN / ED COURSE  As part of my medical decision making, I reviewed the following data within the electronic MEDICAL RECORD NUMBER Nursing notes reviewed and incorporated, Labs reviewed , EKG interpreted , Old chart reviewed, Radiograph reviewed  and Notes from prior ED visits    Differential diagnosis includes, but is not limited to, Gastric or duodenal ulcer, esophageal varices, lower GI bleeding, alcohol induced metabolic derangement such as alcoholic ketoacidosis, hypomagnesemia, potomania, etc.  Lab work is pending.  I have started 1 L of normal saline and I am providing morphine 4 mg IV and Zofran 4 mg IV for analgesia.  Urine drug screen and urinalysis are pending as well although have low suspicion for UTI.  I anticipate that he will have positive alcohol as well.  I am concerned that he may have a perforated ulcer based on the degree of tenderness in his abdomen and I will obtain a CT scan without contrast since he is not tolerating any oral intake once we get back the results of the metabolic  panel and know his kidney function is appropriate.  His vital signs are generally reassuring; he is extremely hypertensive now but I will recheck it after treating his pain.  I anticipate he may also benefit from some Ativan both for his chronic alcohol abuse and the possibility that he could be using illicit drugs even though he denies it.  He is stable at this time but he has 2 large-bore peripheral IVs and we can administer emergent blood products if needed.  His rectal exam is notable for light brown stool which is weakly heme positive which is actually relatively reassuring.  Clinical Course as of Aug 22 734  [facility] - B Aug 21, 2018  2354 Cocaine Metabolite,Ur [place](!): POSITIVE [CF]  2354 Alcohol, Ethyl (B)(!): 237 [CF]  Sat Aug 22, 2018  0019 No concerning findings on UA  Urinalysis, Routine w reflex  microscopic(!) [CF]  0019 Patient still reporting severe pain.  Given the reasons described above including chronic alcohol abuse as well as the positive findings of cocaine in his urine, I am administering Ativan 1 mg IV and will continue to reassess.   [CF]  0149 Troponin I: <0.03 [CF]  0150 The patient's CT scan is concerning for probable duodenal ulcer/inflammation.  Combined with the GI bleeding, the uncontrolled pain, alcoholism, lactic acid of 2.7, and potassium 2.7, I believe that he needs admission.  [facility].  If they are not, I will admit him to this hospital.  He received 1 L of normal saline and then a second liter as a "banana bag" with thiamine and folate and multivitamin.  He is also receiving potassium 40 mill equivalents by mouth and 10 mg once by IV.     [CF]  1610 Patient only received 1L NS prior to the repeat lactic, but is getting the banana bag / 2nd liter now.  Lactic Acid, Venous(!!): 2.2 [CF]  0305 Awaiting word back from [facility] regarding transfer.   [CF]  0413 The patient is sleeping comfortably and is hemodynamically stable although still hypertensive.  Still awaiting word back from the [facility].   [CF]  [phone] Given the patient's continued hypertension, I have ordered amlodipine 5 mg by mouth.  I suspect he is chronically hypertensive and I do not want to drop his blood pressure too rapidly, and I want to avoid beta-blockers given that he is cocaine positive.  I think this is a reasonable choice to start hypertension treatment.   [CF]  [id number] I spoke by phone with Dr. Tobi Bastos at the [facility] emergency department who has accepted the patient.  He is stable for transport.   [CF]  [phone] The patient is now refusing transportation and additional treatment.  He is awake, alert, clinically sober, ambulating without any difficulty, and has the capacity to make his own decisions.  He states he does not like the [facility] and he is tired of waiting.  He will have his family pick him up and take him to [place] or another [place] of his choice.  I explained to them that he has multiple medical issues, is acutely ill, and could get much worse to the point that it leads to lifelong disability or death.  He says he understands that and still wants to go with his family who will take him to a [place] of his choice.  He is unwilling to discuss any other options, treatments, or recommendations.  He understands  he is leaving AGAINST MEDICAL ADVICE.   [CF]    Clinical Course User Index [CF] Loleta Rose, MD    ____________________________________________  FINAL CLINICAL IMPRESSION(S) / ED DIAGNOSES  Final diagnoses:  Gastrointestinal hemorrhage associated with duodenal ulcer  Alcohol abuse  Elevated lactic acid level  Hypokalemia  Cocaine abuse (HCC)  Hypertension, unspecified type     MEDICATIONS GIVEN DURING THIS VISIT:  Medications  LORazepam (ATIVAN) injection 1 mg (has no administration in time range)  morphine 4 MG/ML injection 4 mg (4 mg Intravenous Given 08/21/18 2325)  ondansetron (ZOFRAN) injection 4 mg (4 mg Intravenous Given 08/21/18 2325)  sodium chloride 0.9 % bolus 1,000 mL (0 mLs Intravenous Stopped 08/22/18 0108)  LORazepam (ATIVAN) injection 1 mg (1 mg Intravenous Given 08/22/18 0022)  iopamidol (ISOVUE-300) 61 % injection 100 mL (100 mLs Intravenous Contrast Given 08/22/18 0041)  sodium chloride 0.9 % 1,000 mL with thiamine 100 mg, folic acid 1 mg, multivitamins adult 10 mL infusion ( Intravenous Stopped 08/22/18 0351)  potassium chloride SA (K-DUR,KLOR-CON) CR tablet 40 mEq (40 mEq Oral Given 08/22/18 0136)  potassium chloride 10 mEq in 100 mL IVPB (0 mEq Intravenous Stopped 08/22/18 0351)  amLODipine (NORVASC) tablet 5 mg (5 mg Oral Given 08/22/18 0420)  morphine 4 MG/ML injection 4 mg (4 mg Intravenous Given 08/22/18 0507)     ED Discharge Orders         Ordered    potassium chloride SA  (KLOR-CON M20) 20 MEQ tablet  Daily     08/22/18 0735    ondansetron (ZOFRAN ODT) 4 MG disintegrating tablet     08/22/18 0735           Note:  This document was prepared using Dragon voice recognition software and may include unintentional dictation errors.    Loleta Rose, MD 08/22/18 1610    Loleta Rose, MD 08/22/18 [phone]

## 2018-08-22 ENCOUNTER — Emergency Department: Payer: Self-pay

## 2018-08-22 ENCOUNTER — Encounter: Payer: Self-pay | Admitting: Emergency Medicine

## 2018-08-22 LAB — COMPREHENSIVE METABOLIC PANEL
ALT: 38 U/L (ref 0–44)
ANION GAP: 10 (ref 5–15)
AST: 82 U/L — ABNORMAL HIGH (ref 15–41)
Albumin: 3.5 g/dL (ref 3.5–5.0)
Alkaline Phosphatase: 102 U/L (ref 38–126)
BUN: 6 mg/dL (ref 6–20)
CHLORIDE: 99 mmol/L (ref 98–111)
CO2: 34 mmol/L — AB (ref 22–32)
Calcium: 8.7 mg/dL — ABNORMAL LOW (ref 8.9–10.3)
Creatinine, Ser: 0.9 mg/dL (ref 0.61–1.24)
GFR calc Af Amer: >60 mL/min (ref >60)
GFR calc non Af Amer: >60 mL/min (ref >60)
GLUCOSE: 178 mg/dL — AB (ref 70–99)
Potassium: 2.7 mmol/L — CL (ref 3.5–5.1)
SODIUM: 143 mmol/L (ref 135–145)
Total Bilirubin: 0.4 mg/dL (ref 0.3–1.2)
Total Protein: 7 g/dL (ref 6.5–8.1)

## 2018-08-22 LAB — URINALYSIS, ROUTINE W REFLEX MICROSCOPIC
BILIRUBIN URINE: NEGATIVE
Bacteria, UA: NONE SEEN
GLUCOSE, UA: 50 mg/dL — AB
KETONES UR: NEGATIVE mg/dL
LEUKOCYTES UA: NEGATIVE
Nitrite: NEGATIVE
PROTEIN: NEGATIVE mg/dL
Specific Gravity, Urine: 1.003 — ABNORMAL LOW (ref 1.005–1.030)
Squamous Epithelial / LPF: NONE SEEN (ref 0–5)
WBC UA: NONE SEEN WBC/hpf (ref 0–5)
pH: 7 (ref 5.0–8.0)

## 2018-08-22 LAB — TROPONIN I

## 2018-08-22 LAB — LACTIC ACID, PLASMA
Lactic Acid, Venous: 2.7 mmol/L (ref 0.5–1.9)
Lactic Acid, Venous: 2.2 mmol/L (ref 0.5–1.9)

## 2018-08-22 LAB — MAGNESIUM: Magnesium: 2 mg/dL (ref 1.7–2.4)

## 2018-08-22 LAB — LIPASE, BLOOD: Lipase: 45 U/L (ref 11–51)

## 2018-08-22 MED ORDER — MORPHINE SULFATE (PF) 4 MG/ML IV SOLN
4.0000 mg | Freq: Once | INTRAVENOUS | Status: AC
  Administered 2018-08-22: 4 mg via INTRAVENOUS

## 2018-08-22 MED ORDER — POTASSIUM CHLORIDE 10 MEQ/100ML IV SOLN
10.0000 meq | Freq: Once | INTRAVENOUS | Status: AC
  Administered 2018-08-22: 10 meq via INTRAVENOUS
  Filled 2018-08-22: qty 100

## 2018-08-22 MED ORDER — LORAZEPAM 2 MG/ML IJ SOLN
1.0000 mg | Freq: Once | INTRAMUSCULAR | Status: AC
  Administered 2018-08-22: 1 mg via INTRAVENOUS
  Filled 2018-08-22: qty 1

## 2018-08-22 MED ORDER — THIAMINE HCL 100 MG/ML IJ SOLN
Freq: Once | INTRAVENOUS | Status: AC
  Administered 2018-08-22: 03:00:00 via INTRAVENOUS
  Filled 2018-08-22: qty 1000

## 2018-08-22 MED ORDER — MORPHINE SULFATE (PF) 4 MG/ML IV SOLN
INTRAVENOUS | Status: AC
  Filled 2018-08-22: qty 1

## 2018-08-22 MED ORDER — AMLODIPINE BESYLATE 5 MG PO TABS
5.0000 mg | ORAL_TABLET | Freq: Once | ORAL | Status: AC
  Administered 2018-08-22: 5 mg via ORAL
  Filled 2018-08-22: qty 1

## 2018-08-22 MED ORDER — IOPAMIDOL (ISOVUE-300) INJECTION 61%
100.0000 mL | Freq: Once | INTRAVENOUS | Status: AC | PRN
  Administered 2018-08-22: 100 mL via INTRAVENOUS

## 2018-08-22 MED ORDER — POTASSIUM CHLORIDE CRYS ER 20 MEQ PO TBCR
40.0000 meq | EXTENDED_RELEASE_TABLET | Freq: Once | ORAL | Status: AC
  Administered 2018-08-22: 40 meq via ORAL
  Filled 2018-08-22: qty 2

## 2018-08-22 MED ORDER — POTASSIUM CHLORIDE CRYS ER 20 MEQ PO TBCR: 20.0000 meq | EXTENDED_RELEASE_TABLET | Freq: Every day | ORAL | 0 refills | Status: AC

## 2018-08-22 MED ORDER — LORAZEPAM 2 MG/ML IJ SOLN: 1.0000 mg | Freq: Once | INTRAMUSCULAR | Status: DC | PRN

## 2018-08-22 MED ORDER — ONDANSETRON 4 MG PO TBDP: ORAL_TABLET | ORAL | 0 refills | Status: AC

## 2018-08-22 NOTE — ED Notes (Signed)
Report from rachel, rn.  

## 2018-08-22 NOTE — ED Notes (Signed)
Pt requesting pain medication.  

## 2018-08-22 NOTE — ED Notes (Signed)
NOTE: of kcl charted incorrectly, only given.

## 2018-08-22 NOTE — ED Notes (Signed)
Pt requesting lunesta for sleep. md notified.

## 2018-08-22 NOTE — Discharge Instructions (Addendum)
You have multiple medical problems that need to be treated in the hospital and we arranged transfer and were arranging transportation to the [facility], but you are signing out AGAINST MEDICAL ADVICE.  As we discussed this can lead to worsening illness, long-term disability, or even death.  It is your right to make this decision for your medical care but we strongly encourage you to go as soon as possible to the emergency department of your choice for additional evaluation and treatment of your multiple medical conditions and substance abuse disorders.  You may also return to this emergency department at any time or call 911 for transportation to the nearest emergency department.

## 2018-08-22 NOTE — ED Notes (Signed)
Report to kate, rn.  

## 2018-08-22 NOTE — ED Notes (Signed)
md notified of continued htn. No new orders received.

## 2018-08-22 NOTE — ED Notes (Signed)
Date and time results received: 08/22/18   (use smartphrase ".now" to insert current time)  Test: Lactic Acid Critical Value: 2.2  Name of Provider Notified: York Cerise Orders Received? Or Actions Taken?: No new orders

## 2018-08-22 NOTE — ED Notes (Signed)
Pt came out to nurses desk stating that he wants to sign out AMA and drive to [place]. Informed pt that he has been accepted to [facility]. Pt refusing to go to that facility, states "I have problems with them" states he wants to go to [place]". Informed pt that EMS can only transport him to [facility] since that is where pt has been accepted. Pt stating "then let me sign out." pt informed by this RN that he received pain medication and was intoxicated when he came in and that he is not allowed to drive himself. Pt remains adamant on signing out AMA and continues to refuse to be transferred to [facility]. Dr. York Cerise informed and in room talking to pt.

## 2018-08-22 NOTE — ED Notes (Signed)
Pt up to commode for bowel movement. Pt states "would it be faster for me to drive myself to the [place]?" pt informed that [facility] begun and [facility]. Pt informed that he may sign out against medical advice if he needs to leave, but he has had morphine and should not drive. Pt verbalizes understanding and states he [facility] to [facility].

## 2018-10-15 ENCOUNTER — Encounter: Payer: Self-pay | Admitting: Emergency Medicine

## 2018-10-15 ENCOUNTER — Other Ambulatory Visit: Payer: Self-pay

## 2018-10-15 ENCOUNTER — Emergency Department
Admission: EM | Admit: 2018-10-15 | Discharge: 2018-10-19 | Disposition: A | Discharge status: Other Acute Inpatient Hospital | Payer: No Typology Code available for payment source | Attending: Emergency Medicine | Admitting: Emergency Medicine

## 2018-10-15 ENCOUNTER — Emergency Department: Payer: No Typology Code available for payment source

## 2018-10-15 DIAGNOSIS — Z7984 Long term (current) use of oral hypoglycemic drugs: Secondary | ICD-10-CM | POA: Insufficient documentation

## 2018-10-15 DIAGNOSIS — E119 Type 2 diabetes mellitus without complications: Secondary | ICD-10-CM | POA: Diagnosis not present

## 2018-10-15 DIAGNOSIS — F1410 Cocaine abuse, uncomplicated: Secondary | ICD-10-CM | POA: Diagnosis present

## 2018-10-15 DIAGNOSIS — F1910 Other psychoactive substance abuse, uncomplicated: Secondary | ICD-10-CM | POA: Diagnosis not present

## 2018-10-15 DIAGNOSIS — F10230 Alcohol dependence with withdrawal, uncomplicated: Secondary | ICD-10-CM | POA: Diagnosis not present

## 2018-10-15 DIAGNOSIS — F17210 Nicotine dependence, cigarettes, uncomplicated: Secondary | ICD-10-CM | POA: Diagnosis not present

## 2018-10-15 DIAGNOSIS — Z79899 Other long term (current) drug therapy: Secondary | ICD-10-CM | POA: Insufficient documentation

## 2018-10-15 DIAGNOSIS — F329 Major depressive disorder, single episode, unspecified: Secondary | ICD-10-CM | POA: Diagnosis not present

## 2018-10-15 DIAGNOSIS — T50901A Poisoning by unspecified drugs, medicaments and biological substances, accidental (unintentional), initial encounter: Secondary | ICD-10-CM | POA: Diagnosis not present

## 2018-10-15 DIAGNOSIS — I10 Essential (primary) hypertension: Secondary | ICD-10-CM | POA: Diagnosis not present

## 2018-10-15 DIAGNOSIS — F10930 Alcohol use, unspecified with withdrawal, uncomplicated: Secondary | ICD-10-CM

## 2018-10-15 DIAGNOSIS — F1020 Alcohol dependence, uncomplicated: Secondary | ICD-10-CM | POA: Diagnosis present

## 2018-10-15 LAB — ACETAMINOPHEN LEVEL: Acetaminophen (Tylenol), Serum: <10 ug/mL — ABNORMAL LOW (ref 10–30)

## 2018-10-15 LAB — CBC WITH DIFFERENTIAL/PLATELET
ABS IMMATURE GRANULOCYTES: 0.02 10*3/uL (ref 0.00–0.07)
Basophils Absolute: 0 10*3/uL (ref 0.0–0.1)
Basophils Relative: 0 %
EOS PCT: 1 %
Eosinophils Absolute: 0 10*3/uL (ref 0.0–0.5)
HEMATOCRIT: 45.7 % (ref 39.0–52.0)
HEMOGLOBIN: 15.4 g/dL (ref 13.0–17.0)
Immature Granulocytes: 0 %
LYMPHS ABS: 0.8 10*3/uL (ref 0.7–4.0)
LYMPHS PCT: 15 %
MCH: 28.9 pg (ref 26.0–34.0)
MCHC: 33.7 g/dL (ref 30.0–36.0)
MCV: 85.9 fL (ref 80.0–100.0)
MONO ABS: 0.7 10*3/uL (ref 0.1–1.0)
MONOS PCT: 13 %
NEUTROS ABS: 3.6 10*3/uL (ref 1.7–7.7)
Neutrophils Relative %: 71 %
Platelets: 188 10*3/uL (ref 150–400)
RBC: 5.32 MIL/uL (ref 4.22–5.81)
RDW: 14.4 % (ref 11.5–15.5)
WBC: 5.1 10*3/uL (ref 4.0–10.5)
nRBC: 0 % (ref 0.0–0.2)

## 2018-10-15 LAB — URINE DRUG SCREEN, QUALITATIVE (ARMC ONLY)
AMPHETAMINES, UR SCREEN: NOT DETECTED
Barbiturates, Ur Screen: NOT DETECTED
Benzodiazepine, Ur Scrn: NOT DETECTED
CANNABINOID 50 NG, UR ~~LOC~~: NOT DETECTED
Cocaine Metabolite,Ur ~~LOC~~: POSITIVE — AB
MDMA (ECSTASY) UR SCREEN: NOT DETECTED
Methadone Scn, Ur: NOT DETECTED
OPIATE, UR SCREEN: NOT DETECTED
PHENCYCLIDINE (PCP) UR S: NOT DETECTED
Tricyclic, Ur Screen: NOT DETECTED

## 2018-10-15 LAB — COMPREHENSIVE METABOLIC PANEL
ALBUMIN: 3.9 g/dL (ref 3.5–5.0)
ALK PHOS: 130 U/L — AB (ref 38–126)
ALT: 39 U/L (ref 0–44)
ANION GAP: 10 (ref 5–15)
AST: 78 U/L — ABNORMAL HIGH (ref 15–41)
BILIRUBIN TOTAL: 1.4 mg/dL — AB (ref 0.3–1.2)
BUN: 9 mg/dL (ref 6–20)
CALCIUM: 8.8 mg/dL — AB (ref 8.9–10.3)
CO2: 25 mmol/L (ref 22–32)
CREATININE: 0.82 mg/dL (ref 0.61–1.24)
Chloride: 104 mmol/L (ref 98–111)
GFR calc Af Amer: >60 mL/min (ref >60)
GFR calc non Af Amer: >60 mL/min (ref >60)
GLUCOSE: 115 mg/dL — AB (ref 70–99)
Potassium: 3.5 mmol/L (ref 3.5–5.1)
Sodium: 139 mmol/L (ref 135–145)
TOTAL PROTEIN: 7.3 g/dL (ref 6.5–8.1)

## 2018-10-15 LAB — MAGNESIUM: MAGNESIUM: 1.9 mg/dL (ref 1.7–2.4)

## 2018-10-15 LAB — ETHANOL

## 2018-10-15 LAB — SALICYLATE LEVEL: Salicylate Lvl: <7 mg/dL (ref 2.8–30.0)

## 2018-10-15 MED ORDER — THIAMINE HCL 100 MG/ML IJ SOLN: 100.0000 mg | Freq: Every day | INTRAMUSCULAR | Status: DC

## 2018-10-15 MED ORDER — LORAZEPAM 1 MG PO TABS
1.0000 mg | ORAL_TABLET | Freq: Four times a day (QID) | ORAL | Status: AC | PRN
  Administered 2018-10-16 – 2018-10-18 (×8): 1 mg via ORAL
  Filled 2018-10-15 (×8): qty 1

## 2018-10-15 MED ORDER — LORAZEPAM 2 MG/ML IJ SOLN: 1.0000 mg | Freq: Four times a day (QID) | INTRAMUSCULAR | Status: AC | PRN

## 2018-10-15 MED ORDER — FOLIC ACID 1 MG PO TABS
1.0000 mg | ORAL_TABLET | Freq: Every day | ORAL | Status: DC
  Administered 2018-10-15 – 2018-10-19 (×5): 1 mg via ORAL
  Filled 2018-10-15 (×5): qty 1

## 2018-10-15 MED ORDER — SODIUM CHLORIDE 0.9 % IV BOLUS: 500.0000 mL | Freq: Once | INTRAVENOUS | Status: DC

## 2018-10-15 MED ORDER — VITAMIN B-1 100 MG PO TABS
100.0000 mg | ORAL_TABLET | Freq: Every day | ORAL | Status: DC
  Administered 2018-10-15 – 2018-10-19 (×5): 100 mg via ORAL
  Filled 2018-10-15 (×5): qty 1

## 2018-10-15 MED ORDER — ACETAMINOPHEN 500 MG PO TABS
1000.0000 mg | ORAL_TABLET | ORAL | Status: AC
  Administered 2018-10-15: 1000 mg via ORAL
  Filled 2018-10-15: qty 2

## 2018-10-15 MED ORDER — ADULT MULTIVITAMIN W/MINERALS CH
1.0000 | ORAL_TABLET | Freq: Every day | ORAL | Status: DC
  Administered 2018-10-15 – 2018-10-19 (×5): 1 via ORAL
  Filled 2018-10-15 (×5): qty 1

## 2018-10-15 MED ORDER — SODIUM CHLORIDE 0.9 % IV BOLUS
500.0000 mL | Freq: Once | INTRAVENOUS | Status: AC
  Administered 2018-10-15: 500 mL via INTRAVENOUS

## 2018-10-15 NOTE — ED Triage Notes (Signed)
Pt brought into [facility] where family reported that they found him on the ground with several pill bottles. Pt reports that he took crack cocaine a few days ago and has not been able to sleep. He took extra sleeping pills to get some sleep. Denies wanting to hurt himself but does want help to get off of drugs. His pill bottles have all the labels torn off of them. He knows that he took [place].

## 2018-10-15 NOTE — ED Notes (Signed)
TTS at bedside. 

## 2018-10-15 NOTE — ED Provider Notes (Signed)
-----------------------------------------   11:04 PM on 10/15/2018 -----------------------------------------  I took over care on this patient with Dr. Fanny Bien.  The patient presents after a medication overdose, stating that he just wanted to sleep.  He denies SI and does not demonstrate acute danger to self or others.  He is here voluntarily.  The plan will be that if the patient remains stable around midnight, he will be medically cleared.  At that time he will await TTS and psychiatry evaluation.  I will sign out the patient to the oncoming physician.   Dionne Bucy, MD 10/15/18 [phone]

## 2018-10-15 NOTE — ED Notes (Signed)
ED Provider at bedside. 

## 2018-10-15 NOTE — ED Notes (Signed)
Patient transported to CT 

## 2018-10-15 NOTE — ED Notes (Signed)
TTS finished and left room.
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
## 2018-10-16 MED ORDER — PANTOPRAZOLE SODIUM 40 MG PO TBEC
40.0000 mg | DELAYED_RELEASE_TABLET | Freq: Every day | ORAL | Status: DC
  Administered 2018-10-16 – 2018-10-19 (×4): 40 mg via ORAL
  Filled 2018-10-16 (×4): qty 1

## 2018-10-16 MED ORDER — GABAPENTIN 300 MG PO CAPS
300.0000 mg | ORAL_CAPSULE | Freq: Three times a day (TID) | ORAL | Status: DC
  Administered 2018-10-16 – 2018-10-19 (×9): 300 mg via ORAL
  Filled 2018-10-16 (×11): qty 1

## 2018-10-16 MED ORDER — HYDROCHLOROTHIAZIDE 25 MG PO TABS
25.0000 mg | ORAL_TABLET | Freq: Every day | ORAL | Status: DC
  Administered 2018-10-16 – 2018-10-19 (×4): 25 mg via ORAL
  Filled 2018-10-16 (×4): qty 1

## 2018-10-16 MED ORDER — OXYCODONE-ACETAMINOPHEN 5-325 MG PO TABS
1.0000 | ORAL_TABLET | Freq: Four times a day (QID) | ORAL | Status: DC | PRN
  Administered 2018-10-16 – 2018-10-19 (×10): 1 via ORAL
  Filled 2018-10-16 (×10): qty 1

## 2018-10-16 MED ORDER — METFORMIN HCL 500 MG PO TABS
250.0000 mg | ORAL_TABLET | Freq: Two times a day (BID) | ORAL | Status: DC
  Administered 2018-10-16 – 2018-10-17 (×3): 250 mg via ORAL
  Administered 2018-10-18: 17:00:00 via ORAL
  Administered 2018-10-18 – 2018-10-19 (×2): 250 mg via ORAL
  Filled 2018-10-16 (×6): qty 1

## 2018-10-16 MED ORDER — POTASSIUM CHLORIDE CRYS ER 20 MEQ PO TBCR
20.0000 meq | EXTENDED_RELEASE_TABLET | Freq: Every day | ORAL | Status: DC
  Administered 2018-10-16 – 2018-10-19 (×4): 20 meq via ORAL
  Filled 2018-10-16 (×4): qty 1

## 2018-10-16 MED ORDER — ZOLPIDEM TARTRATE 5 MG PO TABS
5.0000 mg | ORAL_TABLET | Freq: Every evening | ORAL | Status: DC | PRN
  Administered 2018-10-16 – 2018-10-17 (×2): 5 mg via ORAL
  Filled 2018-10-16 (×3): qty 1

## 2018-10-16 MED ORDER — ACETAMINOPHEN 500 MG PO TABS
1000.0000 mg | ORAL_TABLET | Freq: Once | ORAL | Status: AC
  Administered 2018-10-16: 1000 mg via ORAL
  Filled 2018-10-16: qty 2

## 2018-10-16 MED ORDER — ATORVASTATIN CALCIUM 10 MG PO TABS
10.0000 mg | ORAL_TABLET | Freq: Every day | ORAL | Status: DC
  Administered 2018-10-17: 10 mg via ORAL
  Filled 2018-10-16 (×2): qty 1

## 2018-10-16 MED ORDER — TERAZOSIN HCL 1 MG PO CAPS
1.0000 mg | ORAL_CAPSULE | Freq: Every day | ORAL | Status: DC
  Administered 2018-10-16 – 2018-10-17 (×2): 1 mg via ORAL
  Filled 2018-10-16 (×5): qty 1

## 2018-10-16 MED ORDER — AMLODIPINE BESYLATE 5 MG PO TABS
10.0000 mg | ORAL_TABLET | Freq: Every day | ORAL | Status: DC
  Administered 2018-10-16 – 2018-10-19 (×4): 10 mg via ORAL
  Filled 2018-10-16 (×4): qty 2

## 2018-10-16 NOTE — ED Notes (Signed)
Pt given breakfast tray
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
## 2018-10-16 NOTE — ED Notes (Signed)
Patient transferred to room 2, no signs of distress, will continue to monitor, patient is safe, denies Si/hi or avh.

## 2018-10-16 NOTE — BH Assessment (Signed)
This Clinical research associate contacted [facility] [phone] ext [phone]) who reports NO BEDS AVAILABLE.  This Clinical research associate then contacted [facility] Marland KitchenLydia Coleman [phone]) who reports they currently have inpatient beds available. The following information was requested: psych consult, ED notes, labs, and medications.  The requested information was faxed to [phone].

## 2018-10-16 NOTE — ED Notes (Signed)
Pt awake and co pain 10/10 to his back from his fall yesterday. Requesting tylenol for his pain. Dr Lamont Snowball informed and will order the medication.

## 2018-10-16 NOTE — ED Notes (Signed)
SOC computer in room.

## 2018-10-16 NOTE — ED Notes (Signed)
Pt ambulated to and from bathroom unassisted

## 2018-10-16 NOTE — ED Notes (Signed)
Report to Dr Rod Can from [facility]. Pt taken to consult room for interview.

## 2018-10-16 NOTE — ED Notes (Signed)
Pt observed rubbing hand sanitizer on his arms and head.

## 2018-10-16 NOTE — ED Notes (Signed)
Pt medications taken to the pharmacy to be stored until discharged.

## 2018-10-16 NOTE — BH Assessment (Signed)
Pt information has been faxed to [facility] - [phone]  This writer called to follow-up on referral information and Lydia Guiles Pharmacist, community) reports pt is being reviewed by the accepting provider.  [facility] [place] Transfer Coordinators: Lydia Guiles - [phone] Cindy Hazy - [phone] Albesa Seen - [phone]    Call after hours number at [phone] ext. [phone] (Friday-Monday) to follow-up on status of referral/bed placement.

## 2018-10-16 NOTE — ED Notes (Signed)
Pt. Waiting to hallway for work back from [place] offices.  Pt. Calm and cooperative.

## 2018-10-16 NOTE — BH Assessment (Signed)
This Clinical research associate consulted with pt to inquire his desired treatment. Pt requested that he would like to be admitted to the [facility]. This Clinical research associate informed pt that she would seek bed availability.  When this writer called [facility] to inquire about bed availability [phone] ext. [phone]) they reported they are currently on diversion and do not have any inpatient psych beds available.  They suggested calling the [place] hospital in [place], [place] to inquire about available inpatient beds. This Clinical research associate spoke with [employer] Pharmacist, community - [place], [place] .Marland Kitchen. [phone]. [phone]) and she confirmed they currently have beds available.  This Clinical research associate consulted with pt to see if he was okay with this location given the amount of distance from his place of residence.

## 2018-10-17 NOTE — ED Notes (Signed)
Pt is taking a shower.

## 2018-10-17 NOTE — ED Provider Notes (Signed)
-----------------------------------------   6:25 AM on 10/17/2018 -----------------------------------------   Blood pressure 131/90, pulse (!) 102, temperature 98.7 F (37.1 C), temperature source Oral, resp. rate 16, height 5\' 6"  (1.676 m), weight 79.4 kg, SpO2 99 %.  The patient had no acute events since last update.  Calm and cooperative at this time.  Disposition is pending Psychiatry/Behavioral Medicine team recommendations.    Merrily Brittle, MD 10/17/18 [phone]

## 2018-10-17 NOTE — ED Notes (Signed)
Patient has been resting in bed during shift. Patient awake at 0510 went to restroom and then was asking for pain medication stating pain was 10/10 in his back. CIWA preformed and patient has score of 8. PRN medications given per orders. Patient is currently back in bed resting with eyes closed. No distress observed. Will continue to monitor.

## 2018-10-17 NOTE — BH Assessment (Signed)
Counselor telephoned [place] [phone]) to inquire about placement updates for this patient.  Staff stated the referral was still under review by the doctor.

## 2018-10-17 NOTE — ED Notes (Signed)
Pt. Laying in bed watching tv.  Pt. States feeling anxious with back pain.  Pt. Advised he had prn medications.  Pt. Also asking when he would be transported to [place].  Pt. Advised this nurse would look into it.

## 2018-10-17 NOTE — ED Notes (Signed)
Lunch tray given to pt.

## 2018-10-17 NOTE — ED Notes (Signed)
Pt. Requested and was given crackers for snack.

## 2018-10-17 NOTE — ED Notes (Signed)
Pt was calm and cooperative upon approach. He asked about "the medication for the shakes" and was informed when it had last been given. He said he "feels OK" currently. Pt not markedly tremulous. He denied SI and HI but endorsed seeing shadows since he had stopped smoking crack. He contracted for safety. Will continue to monitor for needs and safety.

## 2018-10-18 MED ORDER — LORAZEPAM 2 MG/ML IJ SOLN: 0.0000 mg | Freq: Two times a day (BID) | INTRAMUSCULAR | Status: DC

## 2018-10-18 MED ORDER — CHLORDIAZEPOXIDE HCL 25 MG PO CAPS
100.0000 mg | ORAL_CAPSULE | Freq: Once | ORAL | Status: AC
  Administered 2018-10-18: 100 mg via ORAL
  Filled 2018-10-18: qty 4

## 2018-10-18 MED ORDER — VITAMIN B-1 100 MG PO TABS
100.0000 mg | ORAL_TABLET | Freq: Every day | ORAL | Status: DC
  Administered 2018-10-19: 100 mg via ORAL

## 2018-10-18 MED ORDER — THIAMINE HCL 100 MG/ML IJ SOLN: 100.0000 mg | Freq: Every day | INTRAMUSCULAR | Status: DC

## 2018-10-18 MED ORDER — LORAZEPAM 2 MG PO TABS: 0.0000 mg | ORAL_TABLET | Freq: Four times a day (QID) | ORAL | Status: DC

## 2018-10-18 MED ORDER — LORAZEPAM 2 MG PO TABS: 0.0000 mg | ORAL_TABLET | Freq: Two times a day (BID) | ORAL | Status: DC

## 2018-10-18 MED ORDER — DULOXETINE HCL 60 MG PO CPEP
60.0000 mg | ORAL_CAPSULE | Freq: Every day | ORAL | Status: DC
  Administered 2018-10-18 – 2018-10-19 (×2): 60 mg via ORAL
  Filled 2018-10-18 (×2): qty 1

## 2018-10-18 MED ORDER — DULOXETINE HCL 60 MG PO CPEP: 60.0000 mg | ORAL_CAPSULE | Freq: Every day | ORAL | Status: DC

## 2018-10-18 MED ORDER — LORAZEPAM 2 MG/ML IJ SOLN: 0.0000 mg | Freq: Four times a day (QID) | INTRAMUSCULAR | Status: DC

## 2018-10-18 NOTE — ED Notes (Signed)
Nurse talked with patient and He states " I have hit rock bottom, and I want to get help and start 2020 off different not just for me but for my 3 daughters. Patient is calm and cooperative, no signs of distress.

## 2018-10-18 NOTE — ED Notes (Signed)
Vol rec inpatient

## 2018-10-18 NOTE — BHH Counselor (Signed)
[facility] Spoke with Mr. Cliffton AstersWhite, [place] at [place], but doctor has not reviewed admission package yet. Told to call back in 2 hours. Recent influx of patients over the past [place].  Phone # [phone], ext. [phone].

## 2018-10-18 NOTE — BH Specialist Note (Signed)
[facility] to follow [facility] on [facility] for pt. Spoke with Sam, still has not been reviewed. [facility] is not [facility] at this time. Sam states it will not be reviewed until tomorrow. [phone], ext. [phone]

## 2018-10-18 NOTE — ED Notes (Signed)
Patient is taking a shower, no behavioral issues, He calm and cooperative, will continue to montior.

## 2018-10-18 NOTE — ED Notes (Signed)
Hourly rounding reveals patient sleeping in room. No complaints, stable, in no acute distress. Q15 minute rounds and monitoring via Security Cameras to continue. 

## 2018-10-18 NOTE — ED Provider Notes (Signed)
-----------------------------------------   8:27 AM on 10/18/2018 -----------------------------------------   Blood pressure (!) 127/96, pulse (!) 108, temperature 98.7 F (37.1 C), temperature source Oral, resp. rate 18, height 5\' 6"  (1.676 m), weight 79.4 kg, SpO2 97 %.  The patient had no acute events since last update.  Calm and cooperative at this time.  Disposition is pending      Minna Antis, MD 10/18/18 [phone]

## 2018-10-18 NOTE — ED Notes (Signed)
Patient took all po medications, needed pain medication for back, and also was very shaky and nervous, Nurse administered ativan po. Will continue to monitor, camera surveillance in progress for safety.

## 2018-10-18 NOTE — ED Notes (Signed)
Report to include Situation, Background, Assessment, and Recommendations received from [facility]. Patient alert and oriented, warm and dry, in no acute distress. Patient denies SI, HI, AH and pain. Patient states he see"people" that arent there at times.Patient made aware of Q15 minute rounds and security cameras for their safety. Patient instructed to come to me with needs or concerns.

## 2018-10-18 NOTE — ED Provider Notes (Signed)
-----------------------------------------   11:38 PM on 10/18/2018 -----------------------------------------   Blood pressure (!) 152/93, pulse 84, temperature 97.8 F (36.6 C), temperature source Oral, resp. rate 16, height 5\' 6"  (1.676 m), weight 79.4 kg, SpO2 99 %.  Notified by nursing that the patient is beginning to withdraw from alcohol.  CIWA ordered but no meds ordered.  100mg  librium now and I'll add on the ativan for the etoh withdrawal protocol.   Merrily Brittle, MD 10/18/18 [phone]

## 2018-10-18 NOTE — ED Notes (Signed)
Hourly rounding reveals patient in room. No complaints, stable, in no acute distress. Q15 minute rounds and monitoring via Security Cameras to continue. 

## 2018-10-18 NOTE — BHH Counselor (Signed)
Faxed requested information for patient to be admitted 1)Vitals, 2) BAC level, 3) Medical notes. Fax number [phone], spoke with [employer] [employer], phone # [phone], ext. [phone]

## 2018-10-18 NOTE — ED Notes (Signed)
Nurse took His lunch tray to him and He ate 100% of meal and beverage, Patient states that He feels shaky, nurse will reassess for ativan po. Patient is cooperative, camera surveillance for safety.

## 2018-10-19 DIAGNOSIS — F1020 Alcohol dependence, uncomplicated: Secondary | ICD-10-CM | POA: Diagnosis present

## 2018-10-19 DIAGNOSIS — F1410 Cocaine abuse, uncomplicated: Secondary | ICD-10-CM | POA: Diagnosis present

## 2018-10-19 NOTE — ED Notes (Signed)
Hourly rounding reveals patient sleeping in room. No complaints, stable, in no acute distress. Q15 minute rounds and monitoring via Security Cameras to continue. 
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
## 2018-10-19 NOTE — ED Notes (Signed)
Patient asleep in room. No noted distress or abnormal behavior. Will continue 15 minute checks and observation by security cameras for safety. 

## 2018-10-19 NOTE — ED Notes (Signed)
EMTALA reviewed. 

## 2018-10-19 NOTE — ED Notes (Signed)
PT  PLACED  UNDER  IVC PAPERS  PER  DR Emmit Alexanders  MD

## 2018-10-19 NOTE — ED Notes (Signed)
Hourly rounding reveals patient in room. Stable, in no acute distress. Q15 minute rounds and monitoring via Security Cameras to continue. 

## 2018-10-19 NOTE — ED Provider Notes (Signed)
-----------------------------------------   12:25 AM on 10/19/2018 -----------------------------------------   Blood pressure (!) 152/93, pulse 84, temperature 97.8 F (36.6 C), temperature source Oral, resp. rate 16, height 5\' 6"  (1.676 m), weight 79.4 kg, SpO2 99 %.  The patient had no acute events since last update.  Calm and cooperative at this time.  Disposition is pending Psychiatry/Behavioral Medicine team recommendations.  Mild etoh wd improving.    Merrily Brittle, MD 10/19/18 Moses Manners

## 2018-10-19 NOTE — ED Provider Notes (Addendum)
-----------------------------------------   12:29 PM on 10/19/2018 -----------------------------------------   Blood pressure (!) 121/91, pulse 98, temperature 97.7 F (36.5 C), temperature source Oral, resp. rate 18, height 5\' 6"  (1.676 m), weight 79.4 kg, SpO2 99 %.  The patient had no acute events since last update.  Calm and cooperative at this time.  Psychiatry consultation from today reviewed.  Patient is not an imminent danger to himself or others, no current withdrawal syndrome.  Vital signs are normal.  He is planned for transfer to the [facility] under involuntary commitment for ongoing substance abuse treatment.    Sharman Cheek, MD 10/19/18 1244

## 2018-10-19 NOTE — ED Notes (Signed)
Pt discharged to [facility] hospital under IVC. VS stable.  Belongings given to officer. Report called to Jilda Panda, Charity fundraiser.  Pt accepting of disposition.

## 2018-10-19 NOTE — ED Notes (Signed)
[employer]  COUNTY  SHERIFF  DEPT  CALLED  FOR  TRANSPORT  TO [place]

## 2018-10-19 NOTE — BH Assessment (Signed)
Patient has been accepted to [facility].  Patient assigned to unit [facility]. Accepting physician is Dr. Doloris Hall.  Call report to [phone] ext. [phone] ... After hours call ext. [phone].  Representative was Lydia Guiles Pharmacist, community [phone]).   ER Staff is aware of it:  Misty Stanley, ER Secretary  Dr. Scotty Court, ER MD  Rhea/Amy B, Patient's Nurse     Address: [address], [place] [address]

## 2018-10-19 NOTE — BH Assessment (Signed)
Pt reported to ED Psychiatrist that he now would like to enroll in a 28 day SA treatment program with the [place]. It appears that pt is malingering because he is currently homeless with no natural supports.  This Clinical research associate called [place] [place] to inquire about bed availability - NO ANSWER.  This information was relayed to pt's nurse and ED psychiatrist.

## 2019-09-21 IMAGING — CT CT HEAD W/O CM
4 of 7 series · 14 of 47 positions shown, 15 images · non-contrast
Comparison: None.

CLINICAL DATA: Found down on ground

EXAM:
CT HEAD WITHOUT CONTRAST
CT CERVICAL SPINE WITHOUT CONTRAST
TECHNIQUE: Multidetector CT imaging of the head and cervical spine was
performed following the standard protocol without intravenous
contrast. Multiplanar CT image reconstructions of the cervical spine
were also generated.

[Series 2: head wo · axial · 0.47mm/px · z∈[-77,-27]mm · 2 of 30 slices shown, 3 images]
[im 10/30  brain]
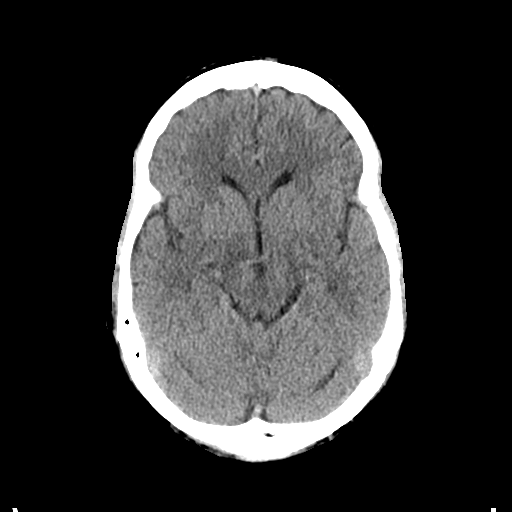
[im 10/30  bone]
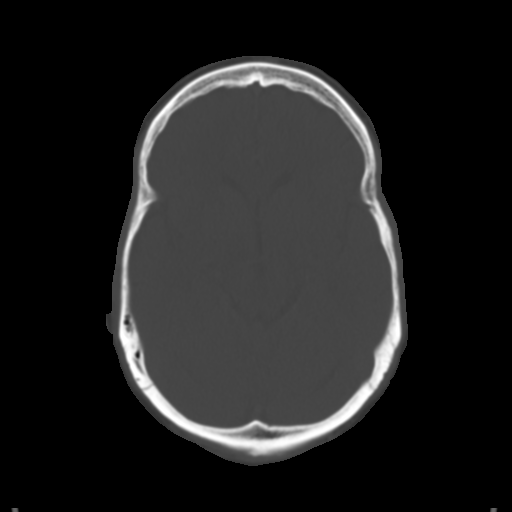
[im 20/30  brain]
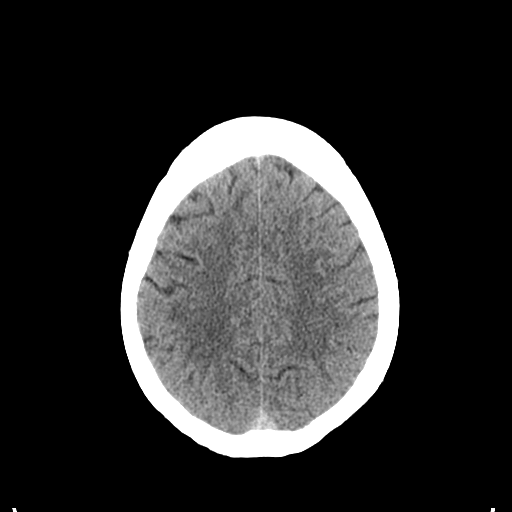

[Series 4: coronal soft tissue · coronal · 0.31mm/px · 3 of 68 slices shown]
[im 26/68  brain]
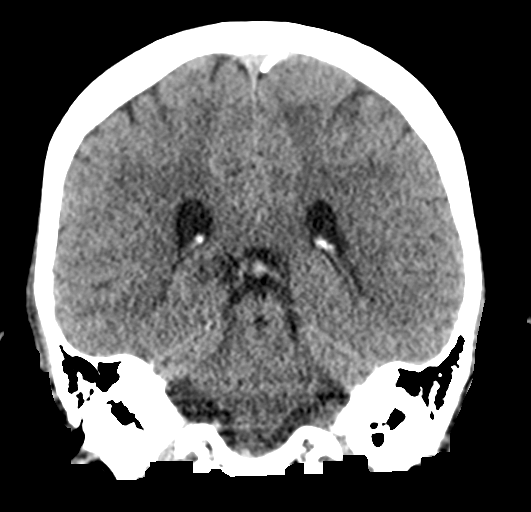
[im 34/68  brain]
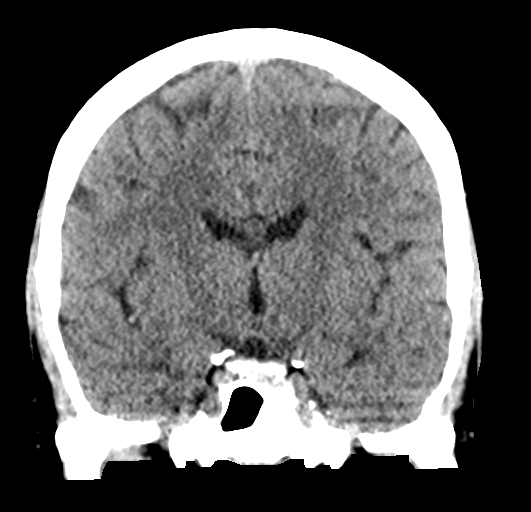
[im 42/68  brain]
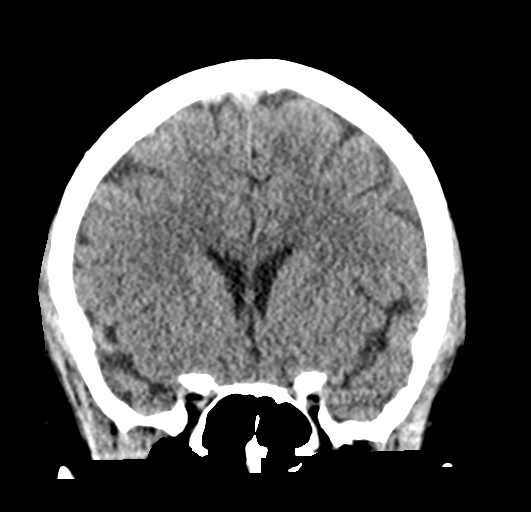

[Series 5: sagittal soft tissue · sagittal · 0.29mm/px · 1 of 54 slices shown]
[im 27/54  brain]
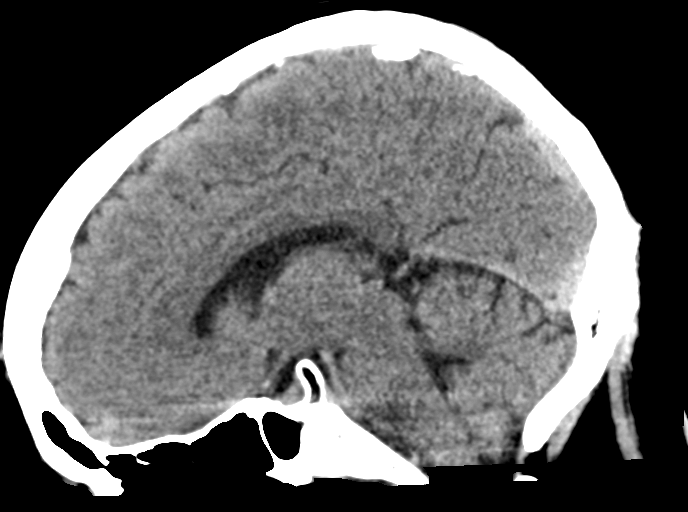

[Series 12: orthogonal bone · axial · 0.18mm/px · z∈[-303,-168]mm · 8 of 95 slices shown]
[im 8/95  bone]
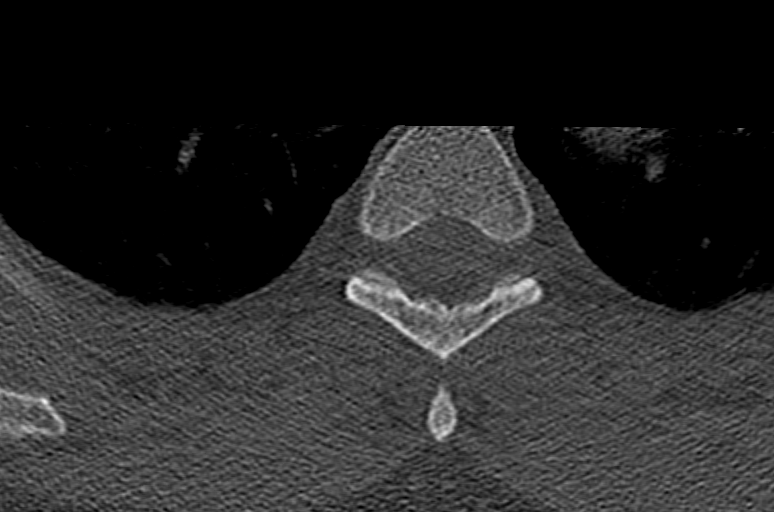
[im 22/95  bone]
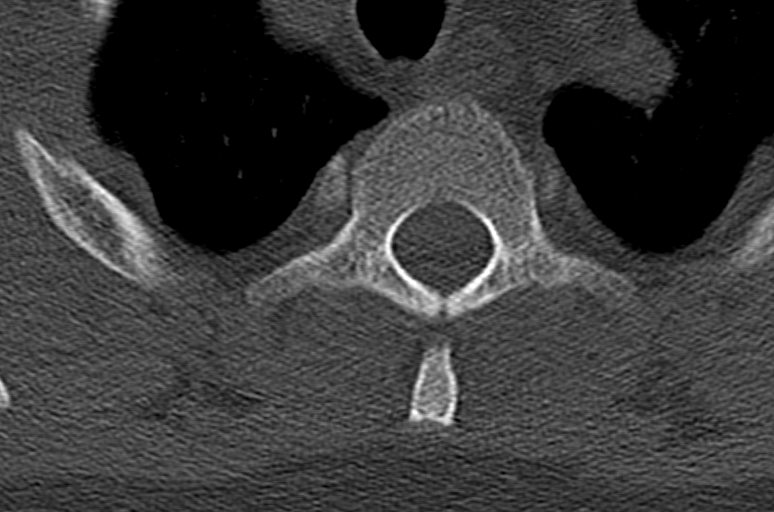
[im 29/95  bone]
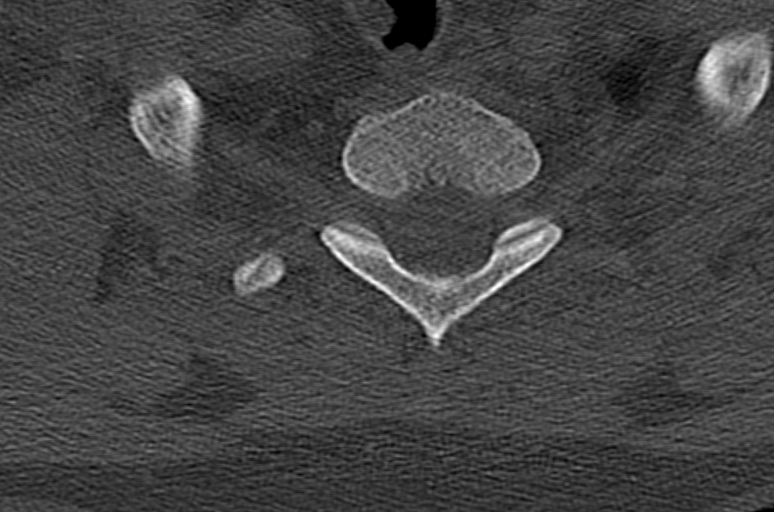
[im 44/95  bone]
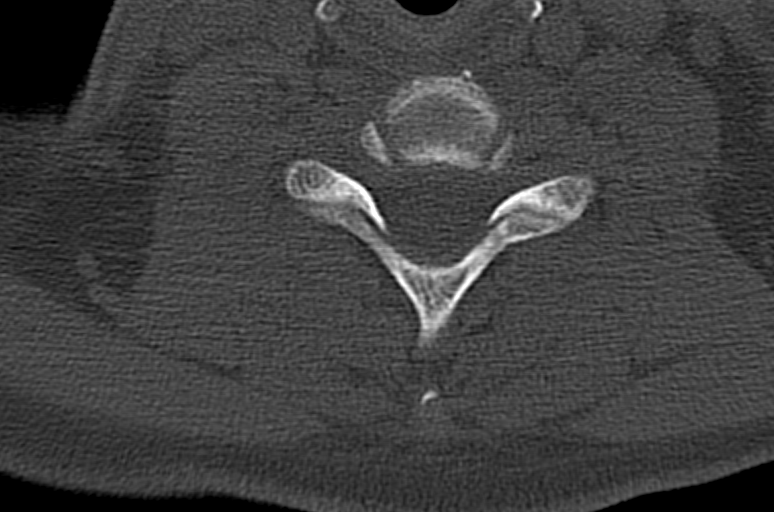
[im 51/95  bone]
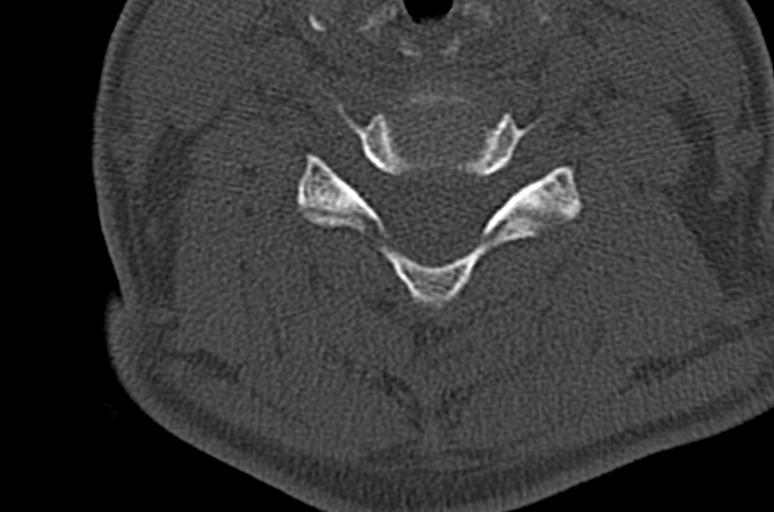
[im 66/95  bone]
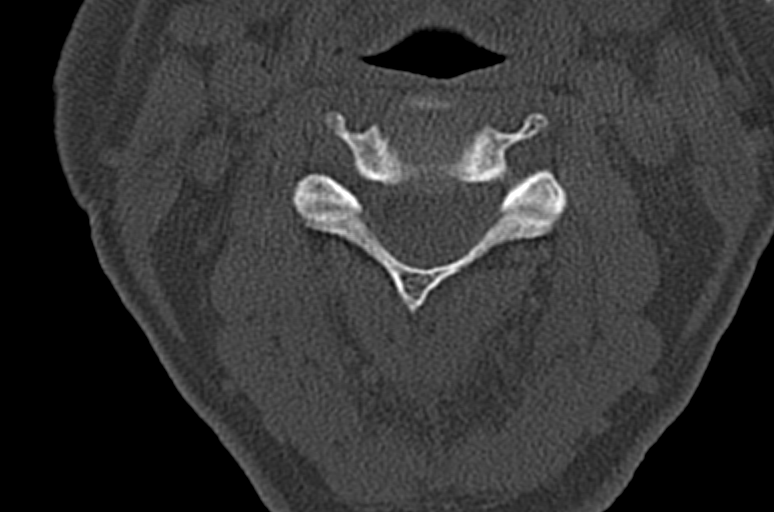
[im 73/95  bone]
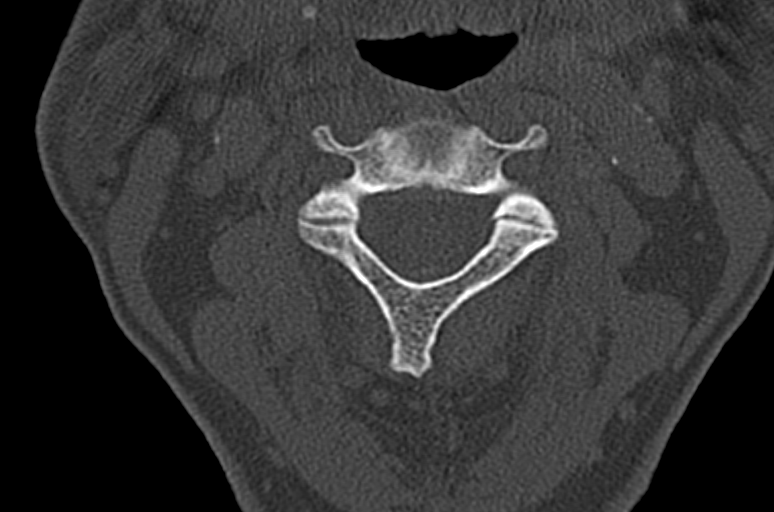
[im 87/95  bone]
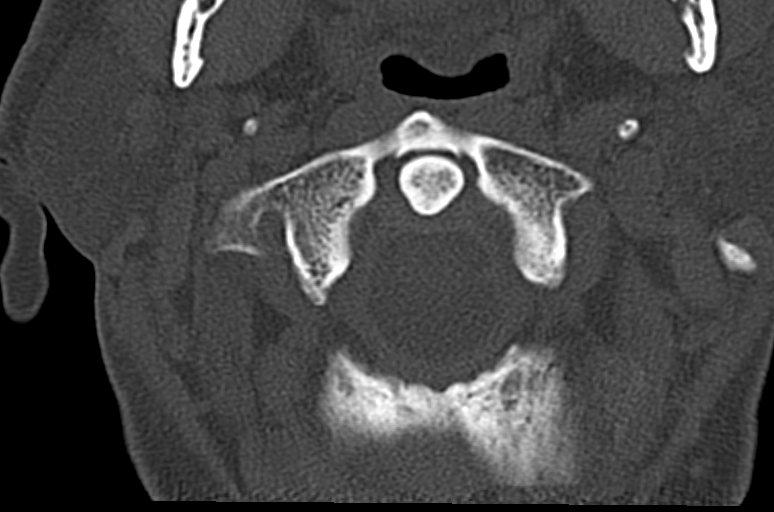

[14 of 47 positions shown; findings below may reference images not displayed]

FINDINGS: CT HEAD FINDINGS

Brain: No acute territorial infarction, hemorrhage or intracranial
mass. Mild hypodensity in the white matter likely small vessel
ischemic change. Ventricles are nonenlarged

Vascular: No hyperdense vessels. Scattered calcifications at the
carotid siphon

Skull: Normal. Negative for fracture or focal lesion.

Sinuses/Orbits: No acute finding.

Other: None

CT CERVICAL SPINE FINDINGS

Alignment: No subluxation.  Facet alignment within normal limits

Skull base and vertebrae: No acute fracture. No primary bone lesion
or focal pathologic process.

Soft tissues and spinal canal: No prevertebral fluid or swelling. No
visible canal hematoma.

Disc levels:  Mild degenerative change C5-C6.

Upper chest: Negative.

Other: None
IMPRESSION: 1. No CT evidence for acute intracranial abnormality.
2. Minimal small vessel ischemic change of the white matter
3. No CT evidence for acute osseous abnormality of the cervical
spine

## 2022-03-14 DEATH — deceased
